# Patient Record
Sex: Female | Born: 1970 | Race: White | Hispanic: No | Smoking: Current every day smoker
Health system: Southern US, Community
[De-identification: ages and names within clinical notes are randomized; demographics above are authoritative.]

## PROBLEM LIST (undated history)

## (undated) DIAGNOSIS — E079 Disorder of thyroid, unspecified: Secondary | ICD-10-CM

## (undated) DIAGNOSIS — I10 Essential (primary) hypertension: Secondary | ICD-10-CM

## (undated) HISTORY — PX: LAPAROTOMY: SHX154

## (undated) HISTORY — PX: PANCREATIC PSEUDOCYST DRAINAGE: SHX2158

---

## 2020-01-26 ENCOUNTER — Other Ambulatory Visit: Payer: Self-pay

## 2020-01-26 ENCOUNTER — Emergency Department (HOSPITAL_COMMUNITY): Payer: Federal, State, Local not specified - PPO

## 2020-01-26 ENCOUNTER — Inpatient Hospital Stay (HOSPITAL_COMMUNITY): Payer: Federal, State, Local not specified - PPO

## 2020-01-26 ENCOUNTER — Encounter (HOSPITAL_COMMUNITY): Payer: Self-pay

## 2020-01-26 ENCOUNTER — Inpatient Hospital Stay (HOSPITAL_COMMUNITY)
Admission: EM | Admit: 2020-01-26 | Discharge: 2020-01-28 | DRG: 312 | Disposition: A | Discharge status: Rehabilitation Facility | Payer: Federal, State, Local not specified - PPO | Source: Other Acute Inpatient Hospital | Attending: Internal Medicine | Admitting: Internal Medicine

## 2020-01-26 DIAGNOSIS — D696 Thrombocytopenia, unspecified: Secondary | ICD-10-CM | POA: Diagnosis not present

## 2020-01-26 DIAGNOSIS — G9341 Metabolic encephalopathy: Secondary | ICD-10-CM | POA: Diagnosis present

## 2020-01-26 DIAGNOSIS — Z20822 Contact with and (suspected) exposure to covid-19: Secondary | ICD-10-CM | POA: Diagnosis present

## 2020-01-26 DIAGNOSIS — I10 Essential (primary) hypertension: Secondary | ICD-10-CM | POA: Diagnosis present

## 2020-01-26 DIAGNOSIS — R7989 Other specified abnormal findings of blood chemistry: Secondary | ICD-10-CM | POA: Diagnosis present

## 2020-01-26 DIAGNOSIS — F329 Major depressive disorder, single episode, unspecified: Secondary | ICD-10-CM | POA: Diagnosis present

## 2020-01-26 DIAGNOSIS — E039 Hypothyroidism, unspecified: Secondary | ICD-10-CM | POA: Diagnosis not present

## 2020-01-26 DIAGNOSIS — Y9289 Other specified places as the place of occurrence of the external cause: Secondary | ICD-10-CM

## 2020-01-26 DIAGNOSIS — E538 Deficiency of other specified B group vitamins: Secondary | ICD-10-CM | POA: Diagnosis present

## 2020-01-26 DIAGNOSIS — Z888 Allergy status to other drugs, medicaments and biological substances status: Secondary | ICD-10-CM

## 2020-01-26 DIAGNOSIS — Z79899 Other long term (current) drug therapy: Secondary | ICD-10-CM

## 2020-01-26 DIAGNOSIS — F1010 Alcohol abuse, uncomplicated: Secondary | ICD-10-CM | POA: Diagnosis not present

## 2020-01-26 DIAGNOSIS — I952 Hypotension due to drugs: Principal | ICD-10-CM | POA: Diagnosis present

## 2020-01-26 DIAGNOSIS — K760 Fatty (change of) liver, not elsewhere classified: Secondary | ICD-10-CM | POA: Diagnosis not present

## 2020-01-26 DIAGNOSIS — F17200 Nicotine dependence, unspecified, uncomplicated: Secondary | ICD-10-CM | POA: Diagnosis present

## 2020-01-26 DIAGNOSIS — Z885 Allergy status to narcotic agent status: Secondary | ICD-10-CM | POA: Diagnosis not present

## 2020-01-26 DIAGNOSIS — R188 Other ascites: Secondary | ICD-10-CM | POA: Diagnosis present

## 2020-01-26 DIAGNOSIS — K7682 Hepatic encephalopathy: Secondary | ICD-10-CM

## 2020-01-26 DIAGNOSIS — R160 Hepatomegaly, not elsewhere classified: Secondary | ICD-10-CM | POA: Diagnosis present

## 2020-01-26 DIAGNOSIS — G928 Other toxic encephalopathy: Secondary | ICD-10-CM | POA: Diagnosis present

## 2020-01-26 DIAGNOSIS — G92 Toxic encephalopathy: Secondary | ICD-10-CM | POA: Diagnosis not present

## 2020-01-26 DIAGNOSIS — T59891A Toxic effect of other specified gases, fumes and vapors, accidental (unintentional), initial encounter: Secondary | ICD-10-CM | POA: Diagnosis not present

## 2020-01-26 DIAGNOSIS — T465X5A Adverse effect of other antihypertensive drugs, initial encounter: Secondary | ICD-10-CM | POA: Diagnosis present

## 2020-01-26 DIAGNOSIS — K8020 Calculus of gallbladder without cholecystitis without obstruction: Secondary | ICD-10-CM | POA: Diagnosis present

## 2020-01-26 DIAGNOSIS — E639 Nutritional deficiency, unspecified: Secondary | ICD-10-CM | POA: Diagnosis present

## 2020-01-26 DIAGNOSIS — D539 Nutritional anemia, unspecified: Secondary | ICD-10-CM | POA: Diagnosis not present

## 2020-01-26 DIAGNOSIS — K7200 Acute and subacute hepatic failure without coma: Secondary | ICD-10-CM | POA: Diagnosis not present

## 2020-01-26 DIAGNOSIS — I959 Hypotension, unspecified: Secondary | ICD-10-CM | POA: Diagnosis not present

## 2020-01-26 HISTORY — DX: Essential (primary) hypertension: I10

## 2020-01-26 HISTORY — DX: Disorder of thyroid, unspecified: E07.9

## 2020-01-26 LAB — BASIC METABOLIC PANEL
Anion gap: 7 (ref 5–15)
BUN: 10 mg/dL (ref 6–20)
CO2: 24 mmol/L (ref 22–32)
Calcium: 7.5 mg/dL — ABNORMAL LOW (ref 8.9–10.3)
Chloride: 103 mmol/L (ref 98–111)
Creatinine, Ser: 0.57 mg/dL (ref 0.44–1.00)
GFR calc Af Amer: >60 mL/min (ref >60)
GFR calc non Af Amer: >60 mL/min (ref >60)
Glucose, Bld: 84 mg/dL (ref 70–99)
Potassium: 3.6 mmol/L (ref 3.5–5.1)
Sodium: 134 mmol/L — ABNORMAL LOW (ref 135–145)

## 2020-01-26 LAB — HEPATIC FUNCTION PANEL
ALT: 30 U/L (ref 0–44)
ALT: 28 U/L (ref 0–44)
AST: 77 U/L — ABNORMAL HIGH (ref 15–41)
AST: 53 U/L — ABNORMAL HIGH (ref 15–41)
Albumin: 2 g/dL — ABNORMAL LOW (ref 3.5–5.0)
Albumin: 1.9 g/dL — ABNORMAL LOW (ref 3.5–5.0)
Alkaline Phosphatase: 134 U/L — ABNORMAL HIGH (ref 38–126)
Alkaline Phosphatase: 118 U/L (ref 38–126)
Bilirubin, Direct: 0.6 mg/dL — ABNORMAL HIGH (ref 0.0–0.2)
Bilirubin, Direct: 0.1 mg/dL (ref 0.0–0.2)
Indirect Bilirubin: 0.5 mg/dL (ref 0.3–0.9)
Indirect Bilirubin: 0.2 mg/dL — ABNORMAL LOW (ref 0.3–0.9)
Total Bilirubin: 1.1 mg/dL (ref 0.3–1.2)
Total Bilirubin: 0.3 mg/dL (ref 0.3–1.2)
Total Protein: 4.5 g/dL — ABNORMAL LOW (ref 6.5–8.1)
Total Protein: 4.5 g/dL — ABNORMAL LOW (ref 6.5–8.1)

## 2020-01-26 LAB — VITAMIN B12: Vitamin B-12: 792 pg/mL (ref 180–914)

## 2020-01-26 LAB — RETICULOCYTES
Immature Retic Fract: 24.1 % — ABNORMAL HIGH (ref 2.3–15.9)
RBC.: 2.92 MIL/uL — ABNORMAL LOW (ref 3.87–5.11)
Retic Count, Absolute: 70.7 10*3/uL (ref 19.0–186.0)
Retic Ct Pct: 2.4 % (ref 0.4–3.1)

## 2020-01-26 LAB — CBC WITH DIFFERENTIAL/PLATELET
Abs Immature Granulocytes: 0.02 10*3/uL (ref 0.00–0.07)
Abs Immature Granulocytes: 0.02 10*3/uL (ref 0.00–0.07)
Basophils Absolute: 0 10*3/uL (ref 0.0–0.1)
Basophils Absolute: 0 10*3/uL (ref 0.0–0.1)
Basophils Relative: 0 %
Basophils Relative: 0 %
Eosinophils Absolute: 0.2 10*3/uL (ref 0.0–0.5)
Eosinophils Absolute: 0.1 10*3/uL (ref 0.0–0.5)
Eosinophils Relative: 3 %
Eosinophils Relative: 2 %
HCT: 28.1 % — ABNORMAL LOW (ref 36.0–46.0)
HCT: 30.9 % — ABNORMAL LOW (ref 36.0–46.0)
Hemoglobin: 9 g/dL — ABNORMAL LOW (ref 12.0–15.0)
Hemoglobin: 10 g/dL — ABNORMAL LOW (ref 12.0–15.0)
Immature Granulocytes: 0 %
Immature Granulocytes: 0 %
Lymphocytes Relative: 42 %
Lymphocytes Relative: 33 %
Lymphs Abs: 2.2 10*3/uL (ref 0.7–4.0)
Lymphs Abs: 1.7 10*3/uL (ref 0.7–4.0)
MCH: 33.2 pg (ref 26.0–34.0)
MCH: 33.9 pg (ref 26.0–34.0)
MCHC: 32 g/dL (ref 30.0–36.0)
MCHC: 32.4 g/dL (ref 30.0–36.0)
MCV: 103.7 fL — ABNORMAL HIGH (ref 80.0–100.0)
MCV: 104.7 fL — ABNORMAL HIGH (ref 80.0–100.0)
Monocytes Absolute: 0.7 10*3/uL (ref 0.1–1.0)
Monocytes Absolute: 0.6 10*3/uL (ref 0.1–1.0)
Monocytes Relative: 14 %
Monocytes Relative: 11 %
Neutro Abs: 2.2 10*3/uL (ref 1.7–7.7)
Neutro Abs: 2.7 10*3/uL (ref 1.7–7.7)
Neutrophils Relative %: 41 %
Neutrophils Relative %: 54 %
Platelets: 127 10*3/uL — ABNORMAL LOW (ref 150–400)
Platelets: 101 10*3/uL — ABNORMAL LOW (ref 150–400)
RBC: 2.71 MIL/uL — ABNORMAL LOW (ref 3.87–5.11)
RBC: 2.95 MIL/uL — ABNORMAL LOW (ref 3.87–5.11)
RDW: 17.7 % — ABNORMAL HIGH (ref 11.5–15.5)
RDW: 17.9 % — ABNORMAL HIGH (ref 11.5–15.5)
WBC: 5.4 10*3/uL (ref 4.0–10.5)
WBC: 5.1 10*3/uL (ref 4.0–10.5)
nRBC: 0 % (ref 0.0–0.2)
nRBC: 0 % (ref 0.0–0.2)

## 2020-01-26 LAB — LACTIC ACID, PLASMA
Lactic Acid, Venous: 1.3 mmol/L (ref 0.5–1.9)
Lactic Acid, Venous: 0.9 mmol/L (ref 0.5–1.9)

## 2020-01-26 LAB — I-STAT CHEM 8, ED
BUN: 11 mg/dL (ref 6–20)
Calcium, Ion: 1.12 mmol/L — ABNORMAL LOW (ref 1.15–1.40)
Chloride: 93 mmol/L — ABNORMAL LOW (ref 98–111)
Creatinine, Ser: 0.6 mg/dL (ref 0.44–1.00)
Glucose, Bld: 88 mg/dL (ref 70–99)
HCT: 32 % — ABNORMAL LOW (ref 36.0–46.0)
Hemoglobin: 10.9 g/dL — ABNORMAL LOW (ref 12.0–15.0)
Potassium: 3.9 mmol/L (ref 3.5–5.1)
Sodium: 131 mmol/L — ABNORMAL LOW (ref 135–145)
TCO2: 31 mmol/L (ref 22–32)

## 2020-01-26 LAB — URINALYSIS, ROUTINE W REFLEX MICROSCOPIC
Bilirubin Urine: NEGATIVE
Glucose, UA: NEGATIVE mg/dL
Hgb urine dipstick: NEGATIVE
Ketones, ur: NEGATIVE mg/dL
Leukocytes,Ua: NEGATIVE
Nitrite: NEGATIVE
Protein, ur: NEGATIVE mg/dL
Specific Gravity, Urine: 1.003 — ABNORMAL LOW (ref 1.005–1.030)
pH: 7 (ref 5.0–8.0)

## 2020-01-26 LAB — CBG MONITORING, ED
Glucose-Capillary: 88 mg/dL (ref 70–99)
Glucose-Capillary: 82 mg/dL (ref 70–99)
Glucose-Capillary: 94 mg/dL (ref 70–99)
Glucose-Capillary: 86 mg/dL (ref 70–99)
Glucose-Capillary: 79 mg/dL (ref 70–99)

## 2020-01-26 LAB — ABO/RH: ABO/RH(D): O POS

## 2020-01-26 LAB — FOLATE: Folate: 3.3 ng/mL — ABNORMAL LOW (ref >5.9)

## 2020-01-26 LAB — IRON AND TIBC
Iron: 39 ug/dL (ref 28–170)
Saturation Ratios: 24 % (ref 10.4–31.8)
TIBC: 159 ug/dL — ABNORMAL LOW (ref 250–450)
UIBC: 120 ug/dL

## 2020-01-26 LAB — HIV ANTIBODY (ROUTINE TESTING W REFLEX): HIV Screen 4th Generation wRfx: NONREACTIVE

## 2020-01-26 LAB — RAPID URINE DRUG SCREEN, HOSP PERFORMED
Amphetamines: NOT DETECTED
Barbiturates: NOT DETECTED
Benzodiazepines: POSITIVE — AB
Cocaine: NOT DETECTED
Opiates: NOT DETECTED
Tetrahydrocannabinol: NOT DETECTED

## 2020-01-26 LAB — TYPE AND SCREEN
ABO/RH(D): O POS
Antibody Screen: NEGATIVE

## 2020-01-26 LAB — FERRITIN: Ferritin: 23 ng/mL (ref 11–307)

## 2020-01-26 LAB — AMMONIA: Ammonia: 60 umol/L — ABNORMAL HIGH (ref 9–35)

## 2020-01-26 LAB — PROCALCITONIN: Procalcitonin: <0.1 ng/mL

## 2020-01-26 LAB — I-STAT BETA HCG BLOOD, ED (MC, WL, AP ONLY): I-stat hCG, quantitative: <5 m[IU]/mL (ref <5)

## 2020-01-26 LAB — SARS CORONAVIRUS 2 BY RT PCR (HOSPITAL ORDER, PERFORMED IN ~~LOC~~ HOSPITAL LAB): SARS Coronavirus 2: NEGATIVE

## 2020-01-26 LAB — TSH: TSH: 12.164 u[IU]/mL — ABNORMAL HIGH (ref 0.350–4.500)

## 2020-01-26 LAB — TROPONIN I (HIGH SENSITIVITY): Troponin I (High Sensitivity): 5 ng/L (ref <18)

## 2020-01-26 MED ORDER — LACTATED RINGERS IV BOLUS
1000.0000 mL | Freq: Once | INTRAVENOUS | Status: AC
  Administered 2020-01-26: 1000 mL via INTRAVENOUS

## 2020-01-26 MED ORDER — THIAMINE HCL 100 MG/ML IJ SOLN
100.0000 mg | Freq: Every day | INTRAMUSCULAR | Status: DC
  Administered 2020-01-26 – 2020-01-27 (×2): 100 mg via INTRAVENOUS
  Filled 2020-01-26 (×2): qty 2

## 2020-01-26 MED ORDER — ONDANSETRON HCL 4 MG PO TABS: 4.0000 mg | ORAL_TABLET | Freq: Four times a day (QID) | ORAL | Status: DC | PRN

## 2020-01-26 MED ORDER — LACTULOSE 10 GM/15ML PO SOLN
10.0000 g | Freq: Three times a day (TID) | ORAL | Status: DC
  Administered 2020-01-26 – 2020-01-27 (×5): 10 g via ORAL
  Filled 2020-01-26 (×4): qty 30

## 2020-01-26 MED ORDER — ONDANSETRON HCL 4 MG/2ML IJ SOLN: 4.0000 mg | Freq: Four times a day (QID) | INTRAMUSCULAR | Status: DC | PRN

## 2020-01-26 MED ORDER — ONDANSETRON HCL 4 MG/2ML IJ SOLN
4.0000 mg | Freq: Once | INTRAMUSCULAR | Status: AC
  Administered 2020-01-26: 4 mg via INTRAVENOUS
  Filled 2020-01-26: qty 2

## 2020-01-26 MED ORDER — THIAMINE HCL 100 MG/ML IJ SOLN: 100.0000 mg | Freq: Every day | INTRAMUSCULAR | Status: DC

## 2020-01-26 MED ORDER — GLUCAGON HCL RDNA (DIAGNOSTIC) 1 MG IJ SOLR
2.0000 mg | Freq: Once | INTRAMUSCULAR | Status: AC
  Administered 2020-01-26: 2 mg via INTRAVENOUS
  Filled 2020-01-26: qty 2

## 2020-01-26 MED ORDER — SODIUM CHLORIDE 0.9 % IV BOLUS
1000.0000 mL | Freq: Once | INTRAVENOUS | Status: AC
  Administered 2020-01-26: 1000 mL via INTRAVENOUS

## 2020-01-26 MED ORDER — SODIUM CHLORIDE 0.9 % IV SOLN: INTRAVENOUS | Status: DC

## 2020-01-26 MED ORDER — LEVOTHYROXINE SODIUM 50 MCG PO TABS
75.0000 ug | ORAL_TABLET | Freq: Every day | ORAL | Status: DC
  Administered 2020-01-27 – 2020-01-28 (×2): 75 ug via ORAL
  Filled 2020-01-26 (×2): qty 1

## 2020-01-26 MED ORDER — LEVOTHYROXINE SODIUM 100 MCG/5ML IV SOLN
37.5000 ug | Freq: Every day | INTRAVENOUS | Status: DC
  Administered 2020-01-26: 37.5 ug via INTRAVENOUS
  Filled 2020-01-26: qty 5

## 2020-01-26 MED ORDER — NALOXONE HCL 2 MG/2ML IJ SOSY
1.0000 mg | PREFILLED_SYRINGE | Freq: Once | INTRAMUSCULAR | Status: AC
  Administered 2020-01-26: 1 mg via INTRAVENOUS
  Filled 2020-01-26: qty 2

## 2020-01-26 NOTE — Care Plan (Addendum)
[name] is a 49 year old female with medical history significant for HTN, hypothyroidism, alcohol and benzodiazepine abuse who was undergoing detox at [facility] for the past 4 days and was found to have low normal blood pressure.  Patient was reportedly given her antihypertensives and became hypotensive with SBP in the 60s.  In the ED patient wass noted to have SBP in the 70s with map of 61 and no lactic acidosis and required 2 L of lactated ringer boluses with initiation of maintenance fluids.  UDS positive for benzodiazepines, TSH 12, ammonia 60, procalcitonin less than 0.10, hemoglobin 9.  CT head showed no acute findings but did show premature generalized atrophy.  Chest x-ray favored atelectasis.  For  TRH was called for admission with hypotension presumed secondary to antihypertensive regimen while on Librium protocol for alcohol withdrawal.  Hypotension, improving.  Likely related to antihypertensive regimen while on Librium protocol.  SBP in the 90s - low 100s while on IV fluids with MAP in 80s currently .  Doubt infection given unremarkable procalcitonin, no localizing symptoms, no acute findings on imaging. -Continue IV fluids, decrease rate to 100 cc given concern for cirrhosis to prevent hypovolemia -Hold off on blood pressure regimen.  (Home lisinopril, amlodipine)  Acute metabolic encephalopathy, improved.  Somnolent and lethargic earlier this morning,  now alert oriented x4 still has some delayed speech and slowed thinking.  CT head nonacute.  Likely  related to sedative effect of Librium?.  Ammonia in the 60s  with high concern for cirrhosis -Trial lactulose -Delirium precautions -Avoid sedatives, holding home Valium -Continue IV thiamine, check thiamine levels  Hypothyroidism.  Suspect poor adherence given TSH elevated at greater than 12.  Do not suspect hypotension related to hypothyroidism as blood pressure is improved with IV fluids, has no bradycardia -Tolerating  p.o. we will transition from IV to oral Synthroid.  Thrombocytopenia, elevated ammonia, alcohol abuse, high concern for cirrhosis -Right upper quadrant ultrasound while in hospital  History of alcohol abuse.  Was undergoing detox prior to admission.  No current signs of withdrawal  Remainder per H&P from earlier today.  Physical exam Alert, slow speech but oriented to person, place, contacts Regular rate and rhythm, no edema Abdomen soft, nondistended, nontender, diminished bowel sounds No appreciable focal deficits

## 2020-01-26 NOTE — ED Notes (Signed)
[name], sister-in-law, would like to be contacted at [phone].

## 2020-01-26 NOTE — ED Notes (Signed)
Patient placed in trendelenburg per Nicanor Alcon, MD.

## 2020-01-26 NOTE — ED Notes (Signed)
Nolon Stalls [phone] Called from [facility] to check on pt

## 2020-01-26 NOTE — ED Notes (Signed)
ED TO INPATIENT HANDOFF REPORT  ED Nurse Name and Phone #: Sharene Skeans [phone]  S Name/Age/Gender Krystal Marquez 49 y.o. female Room/Bed: WA17/WA17  Code Status   Code Status: Full Code  Home/SNF/Other Home Patient oriented to: self, place, time and situation Is this baseline? 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 Codeine Nausea Only    "I don't want any that are in this class of drugs"  . Hydrocodone Nausea Only    "I don't want any that are in this class of drugs"  . Oxycodone Nausea Only    "I don't want any that are in this class of drugs"  . Prochlorperazine Edisylate     "lock jaw"    Level of Care/Admitting Diagnosis ED Disposition    ED Disposition Condition Comment   Admit  Hospital Area[facility] [[id number]]  Level of Care: Stepdown [14]  Admit to SDU based on following criteria: Severe physiological/psychological symptoms:  Any diagnosis requiring assessment & intervention at least every 4 hours on an ongoing basis to obtain desired patient outcomes including stability and rehabilitation  Covid Evaluation: Asymptomatic Screening Protocol (No Symptoms)  Diagnosis: Hypotension [656812]  Admitting Physician: Eduard Clos [id number]  Attending Physician: Eduard Clos [phone]  Estimated length of stay: past midnight tomorrow  Certification:: I certify this patient will need inpatient services for at least 2 midnights       B Medical/Surgery History Past Medical History:  Diagnosis Date  . Hypertension   . Thyroid disease    Past Surgical History:  Procedure Laterality Date  . LAPAROTOMY    . PANCREATIC PSEUDOCYST DRAINAGE       A IV Location/Drains/Wounds Patient  Lines/Drains/Airways Status   Active Line/Drains/Airways    Name:   Placement date:   Placement time:   Site:   Days:   Peripheral IV 01/26/20 Hand   01/26/20    0056    Hand   less than 1   Peripheral IV 01/26/20 Left;Posterior Forearm   01/26/20    0205    Forearm   less than 1          Intake/Output Last 24 hours  Intake/Output Summary (Last 24 hours) at 01/26/2020 2249 Last data filed at 01/26/2020 2157 Gross per 24 hour  Intake 2000 ml  Output --  Net 2000 ml    Labs/Imaging Results for orders placed or performed during the hospital encounter of 01/26/20 (from the past 48 hour(s))  CBC with Differential/Platelet     Status: Abnormal   Collection Time: 01/26/20  1:54 AM  Result Value Ref Range   WBC 5.4 4.0 - 10.5 K/uL   RBC 2.71 (L) 3.87 - 5.11 MIL/uL   Hemoglobin 9.0 (L) 12.0 - 15.0 g/dL   HCT 49.4 (L) 49.6 - 75.9 %   MCV 103.7 (H) 80.0 - 100.0 fL   MCH 33.2 26.0 - 34.0 pg   MCHC 32.0 30.0 - 36.0 g/dL   RDW 16.3 (H) 84.6 - 65.9 %   Platelets 127 (L) 150 - 400 K/uL    Comment: Immature Platelet Fraction may be clinically indicated, consider ordering this additional test [id number] REPEATED TO VERIFY    nRBC 0.0 0.0 - 0.2 %  Neutrophils Relative % 41 %   Neutro Abs 2.2 1.7 - 7.7 K/uL   Lymphocytes Relative 42 %   Lymphs Abs 2.2 0.7 - 4.0 K/uL   Monocytes Relative 14 %   Monocytes Absolute 0.7 0.1 - 1.0 K/uL   Eosinophils Relative 3 %   Eosinophils Absolute 0.2 0.0 - 0.5 K/uL   Basophils Relative 0 %   Basophils Absolute 0.0 0.0 - 0.1 K/uL   Immature Granulocytes 0 %   Abs Immature Granulocytes 0.02 0.00 - 0.07 K/uL    Comment: Performed at [facility], [place] [address], [place], [place] [address]  Hepatic function panel     Status: Abnormal   Collection Time: 01/26/20  1:54 AM  Result Value Ref Range   Total Protein 4.5 (L) 6.5 - 8.1 g/dL   Albumin 2.0 (L) 3.5 - 5.0 g/dL   AST 77 (H) 15 - 41 U/L   ALT 30 0 - 44 U/L   Alkaline Phosphatase  134 (H) 38 - 126 U/L   Total Bilirubin 1.1 0.3 - 1.2 mg/dL   Bilirubin, Direct 0.6 (H) 0.0 - 0.2 mg/dL   Indirect Bilirubin 0.5 0.3 - 0.9 mg/dL    Comment: Performed at [facility], [place] [address], [place], [place] [address]  I-Stat Beta hCG blood, ED ([facility], [facility], [facility] only)     Status: None   Collection Time: 01/26/20  2:00 AM  Result Value Ref Range   I-stat hCG, quantitative <5.0 <5 mIU/mL   Comment 3            Comment:   GEST. AGE      CONC.  (mIU/mL)   <=1 WEEK        5 - 50     2 WEEKS       50 - 500     3 WEEKS       100 - 10,000     4 WEEKS     1,000 - 30,000        FEMALE AND NON-PREGNANT FEMALE:     LESS THAN 5 mIU/mL   I-stat chem 8, ED (not at [facility] or [facility])     Status: Abnormal   Collection Time: 01/26/20  2:10 AM  Result Value Ref Range   Sodium 131 (L) 135 - 145 mmol/L   Potassium 3.9 3.5 - 5.1 mmol/L   Chloride 93 (L) 98 - 111 mmol/L   BUN 11 6 - 20 mg/dL   Creatinine, Ser 0.60 0.44 - 1.00 mg/dL   Glucose, Bld 88 70 - 99 mg/dL    Comment: Glucose reference range applies only to samples taken after fasting for at least 8 hours.   Calcium, Ion 1.12 (L) 1.15 - 1.40 mmol/L   TCO2 31 22 - 32 mmol/L   Hemoglobin 10.9 (L) 12.0 - 15.0 g/dL   HCT 32.0 (L) 36.0 - 46.0 %  Urinalysis, Routine w reflex microscopic     Status: Abnormal   Collection Time: 01/26/20  2:20 AM  Result Value Ref Range   Color, Urine STRAW (A) YELLOW   APPearance CLEAR CLEAR   Specific Gravity, Urine 1.003 (L) 1.005 - 1.030   pH 7.0 5.0 - 8.0   Glucose, UA NEGATIVE NEGATIVE mg/dL   Hgb urine dipstick NEGATIVE NEGATIVE   Bilirubin Urine NEGATIVE NEGATIVE   Ketones, ur NEGATIVE NEGATIVE mg/dL   Protein, ur NEGATIVE NEGATIVE mg/dL   Nitrite NEGATIVE NEGATIVE   Leukocytes,Ua NEGATIVE NEGATIVE  Comment: Performed at [facility], [address], [place], [place] [address]  SARS Coronavirus 2 by RT PCR (hospital order, performed in [facility] hospital lab)  Nasopharyngeal Nasopharyngeal Swab     Status: None   Collection Time: 01/26/20  4:20 AM   Specimen: Nasopharyngeal Swab  Result Value Ref Range   SARS Coronavirus 2 NEGATIVE NEGATIVE    Comment: (NOTE) SARS-CoV-2 target nucleic acids are NOT DETECTED. The SARS-CoV-2 RNA is generally detectable in upper and lower respiratory specimens during the acute phase of infection. The lowest concentration of SARS-CoV-2 viral copies this assay can detect is 250 copies / mL. A negative result does not preclude SARS-CoV-2 infection and should not be used as the sole basis for treatment or other patient management decisions.  A negative result may occur with improper specimen collection / handling, submission of specimen other than nasopharyngeal swab, presence of viral mutation(s) within the areas targeted by this assay, and inadequate number of viral copies (<250 copies / mL). A negative result must be combined with clinical observations, patient history, and epidemiological information. Fact Sheet for Patients:   BoilerBrush.com.cy Fact Sheet for Healthcare Providers: https://pope.com/ This test is not yet approved or cleared  by the [place] FDA and has been authorized for detection and/or diagnosis of SARS-CoV-2 by FDA under an Emergency Use Authorization (EUA).  This EUA will remain in effect (meaning this test can be used) for the duration of the COVID-19 declaration under Section 564(b)(1) of the Act, 21 U.S.C. section 360bbb-3(b)(1), unless the authorization is terminated or revoked sooner. Performed at [facility], [address] [place], [place] [address]   Urine rapid drug screen (hosp performed)     Status: Abnormal   Collection Time: 01/26/20  5:51 AM  Result Value Ref Range   Opiates NONE DETECTED NONE DETECTED   Cocaine NONE DETECTED NONE DETECTED   Benzodiazepines POSITIVE (A) NONE DETECTED    Amphetamines NONE DETECTED NONE DETECTED   Tetrahydrocannabinol NONE DETECTED NONE DETECTED   Barbiturates NONE DETECTED NONE DETECTED    Comment: (NOTE) DRUG SCREEN FOR MEDICAL PURPOSES ONLY.  IF CONFIRMATION IS NEEDED FOR ANY PURPOSE, NOTIFY LAB WITHIN 5 DAYS. LOWEST DETECTABLE LIMITS FOR URINE DRUG SCREEN Drug Class                     Cutoff (ng/mL) Amphetamine and metabolites    1000 Barbiturate and metabolites    200 Benzodiazepine                 200 Tricyclics and metabolites     300 Opiates and metabolites        300 Cocaine and metabolites        300 THC                            50 Performed at [facility], [address], [place], [place] [address]   CBG monitoring, ED     Status: None   Collection Time: 01/26/20  6:04 AM  Result Value Ref Range   Glucose-Capillary 88 70 - 99 mg/dL    Comment: Glucose reference range applies only to samples taken after fasting for at least 8 hours.  ABO/Rh     Status: None   Collection Time: 01/26/20  7:00 AM  Result Value Ref Range   ABO/RH(D)      O POS Performed at [facility], [address], [place], [place] [address]   HIV Antibody (routine testing w rflx)     Status: None   Collection Time: 01/26/20  7:01 AM  Result Value Ref Range   HIV Screen 4th Generation wRfx Non Reactive Non Reactive    Comment: Performed at [facility], [address], [place], [place] [address]  Basic metabolic panel     Status: Abnormal   Collection Time: 01/26/20  7:01 AM  Result Value Ref Range   Sodium 134 (L) 135 - 145 mmol/L   Potassium 3.6 3.5 - 5.1 mmol/L   Chloride 103 98 - 111 mmol/L   CO2 24 22 - 32 mmol/L   Glucose, Bld 84 70 - 99 mg/dL    Comment: Glucose reference range applies only to samples taken after fasting for at least 8 hours.   BUN 10 6 - 20 mg/dL   Creatinine, Ser 0.98 0.44 - 1.00 mg/dL   Calcium 7.5 (L) 8.9 - 10.3 mg/dL   GFR calc non Af Amer >60 >60 mL/min   GFR calc  Af Amer >60 >60 mL/min   Anion gap 7 5 - 15    Comment: Performed at [facility], [address] [place], [place] [address]  Hepatic function panel     Status: Abnormal   Collection Time: 01/26/20  7:01 AM  Result Value Ref Range   Total Protein 4.5 (L) 6.5 - 8.1 g/dL   Albumin 1.9 (L) 3.5 - 5.0 g/dL   AST 53 (H) 15 - 41 U/L   ALT 28 0 - 44 U/L   Alkaline Phosphatase 118 38 - 126 U/L   Total Bilirubin 0.3 0.3 - 1.2 mg/dL   Bilirubin, Direct 0.1 0.0 - 0.2 mg/dL   Indirect Bilirubin 0.2 (L) 0.3 - 0.9 mg/dL    Comment: Performed at [facility], [address], [place], [place] [address]  CBC WITH DIFFERENTIAL     Status: Abnormal   Collection Time: 01/26/20  7:01 AM  Result Value Ref Range   WBC 5.1 4.0 - 10.5 K/uL   RBC 2.95 (L) 3.87 - 5.11 MIL/uL   Hemoglobin 10.0 (L) 12.0 - 15.0 g/dL   HCT 62.1 (L) 30.8 - 65.7 %   MCV 104.7 (H) 80.0 - 100.0 fL   MCH 33.9 26.0 - 34.0 pg   MCHC 32.4 30.0 - 36.0 g/dL   RDW 84.6 (H) 96.2 - 95.2 %   Platelets 101 (L) 150 - 400 K/uL    Comment: SPECIMEN CHECKED FOR CLOTS Immature Platelet Fraction may be clinically indicated, consider ordering this additional test [id number] PLATELET COUNT CONFIRMED BY SMEAR REPEATED TO VERIFY    nRBC 0.0 0.0 - 0.2 %   Neutrophils Relative % 54 %   Neutro Abs 2.7 1.7 - 7.7 K/uL   Lymphocytes Relative 33 %   Lymphs Abs 1.7 0.7 - 4.0 K/uL   Monocytes Relative 11 %   Monocytes Absolute 0.6 0.1 - 1.0 K/uL   Eosinophils Relative 2 %   Eosinophils Absolute 0.1 0.0 - 0.5 K/uL   Basophils Relative 0 %   Basophils Absolute 0.0 0.0 - 0.1 K/uL   Immature Granulocytes 0 %   Abs Immature Granulocytes 0.02 0.00 - 0.07 K/uL    Comment: Performed at [facility], [address] [place], [place] [address]  TSH     Status: Abnormal   Collection Time: 01/26/20  7:01 AM  Result Value Ref Range  TSH 12.164 (H) 0.350 - 4.500 uIU/mL    Comment: Performed by a 3rd  Generation assay with a functional sensitivity of <=0.01 uIU/mL. Performed at [facility], [address] [place], [place] [address]   Lactic acid, plasma     Status: None   Collection Time: 01/26/20  7:01 AM  Result Value Ref Range   Lactic Acid, Venous 1.3 0.5 - 1.9 mmol/L    Comment: Performed at [facility], [address] [place], [place] [address]  Troponin I (High Sensitivity)     Status: None   Collection Time: 01/26/20  7:01 AM  Result Value Ref Range   Troponin I (High Sensitivity) 5 <18 ng/L    Comment: (NOTE) Elevated high sensitivity troponin I (hsTnI) values and significant  changes across serial measurements may suggest ACS but many other  chronic and acute conditions are known to elevate hsTnI results.  Refer to the "Links" section for chest pain algorithms and additional  guidance. Performed at [facility], [address] [place], [place] [address]   Ammonia     Status: Abnormal   Collection Time: 01/26/20  7:01 AM  Result Value Ref Range   Ammonia 60 (H) 9 - 35 umol/L    Comment: Performed at [facility], [address] [place], [place] [address]  Culture, blood (routine x 2)     Status: None (Preliminary result)   Collection Time: 01/26/20  7:01 AM   Specimen: BLOOD  Result Value Ref Range   Specimen Description      BLOOD BLOOD LEFT FOREARM Performed at [facility], [address] [place], [place] [address]    Special Requests      BOTTLES DRAWN AEROBIC AND ANAEROBIC Blood Culture results may not be optimal due to an inadequate volume of blood received in culture bottles Performed at [facility], [address] [place], [place] [address]    Culture      NO GROWTH < 12 HOURS Performed at [facility], [address] [place], [place] [address]    Report Status PENDING   Type and screen [facility]      Status: None   Collection Time: 01/26/20  7:01 AM  Result Value Ref Range   ABO/RH(D) O POS    Antibody Screen NEG    Sample Expiration      01/29/2020,2359 Performed at [facility], [address] [place], [place] [address]   Vitamin B12     Status: None   Collection Time: 01/26/20  7:01 AM  Result Value Ref Range   Vitamin B-12 792 180 - 914 pg/mL    Comment: (NOTE) This assay is not validated for testing neonatal or myeloproliferative syndrome specimens for Vitamin B12 levels. Performed at [facility], [address] [place], [place] [address]   Folate     Status: Abnormal   Collection Time: 01/26/20  7:01 AM  Result Value Ref Range   Folate 3.3 (L) >5.9 ng/mL    Comment: Performed at [facility], [address] [place], [place] [address]  Iron and TIBC     Status: Abnormal   Collection Time: 01/26/20  7:01 AM  Result Value Ref Range   Iron 39 28 - 170 ug/dL   TIBC 027159 (L) 253250 - 664450 ug/dL   Saturation Ratios 24 10.4 - 31.8 %   UIBC 120 ug/dL  Comment: Performed at [facility], [address] [place], [place] [address]  Ferritin     Status: None   Collection Time: 01/26/20  7:01 AM  Result Value Ref Range   Ferritin 23 11 - 307 ng/mL    Comment: Performed at [facility], [address] [place], [place] [address]  Reticulocytes     Status: Abnormal   Collection Time: 01/26/20  7:01 AM  Result Value Ref Range   Retic Ct Pct 2.4 0.4 - 3.1 %   RBC. 2.92 (L) 3.87 - 5.11 MIL/uL   Retic Count, Absolute 70.7 19.0 - 186.0 K/uL   Immature Retic Fract 24.1 (H) 2.3 - 15.9 %    Comment: Performed at [facility], [address] [place], [place] [address]  Procalcitonin - Baseline     Status: None   Collection Time: 01/26/20  7:01 AM  Result Value Ref Range   Procalcitonin <0.10 ng/mL    Comment:        Interpretation: PCT (Procalcitonin) <= 0.5  ng/mL: Systemic infection (sepsis) is not likely. Local bacterial infection is possible. (NOTE)       Sepsis PCT Algorithm           Lower Respiratory Tract                                      Infection PCT Algorithm    ----------------------------     ----------------------------         PCT < 0.25 ng/mL                PCT < 0.10 ng/mL         Strongly encourage             Strongly discourage   discontinuation of antibiotics    initiation of antibiotics    ----------------------------     -----------------------------       PCT 0.25 - 0.50 ng/mL            PCT 0.10 - 0.25 ng/mL               OR       >80% decrease in PCT            Discourage initiation of                                            antibiotics      Encourage discontinuation           of antibiotics    ----------------------------     -----------------------------         PCT >= 0.50 ng/mL              PCT 0.26 - 0.50 ng/mL               AND        <80% decrease in PCT             Encourage initiation of                                             antibiotics  Encourage continuation           of antibiotics    ----------------------------     -----------------------------        PCT >= 0.50 ng/mL                  PCT > 0.50 ng/mL               AND         increase in PCT                  Strongly encourage                                      initiation of antibiotics    Strongly encourage escalation           of antibiotics                                     -----------------------------                                           PCT <= 0.25 ng/mL                                                 OR                                        > 80% decrease in PCT                                     Discontinue / Do not initiate                                             antibiotics Performed at [facility], [address] [place], [place] [address]   CBG monitoring, ED     Status: None    Collection Time: 01/26/20  7:54 AM  Result Value Ref Range   Glucose-Capillary 82 70 - 99 mg/dL    Comment: Glucose reference range applies only to samples taken after fasting for at least 8 hours.  Lactic acid, plasma     Status: None   Collection Time: 01/26/20 10:33 AM  Result Value Ref Range   Lactic Acid, Venous 0.9 0.5 - 1.9 mmol/L    Comment: Performed at [facility], [address], [place], [place] [address]  Culture, blood (routine x 2)     Status: None (Preliminary result)   Collection Time: 01/26/20 10:33 AM   Specimen: BLOOD  Result Value Ref Range   Specimen Description      BLOOD RIGHT ANTECUBITAL Performed at [facility], [address] [place], [place] [address]    Special Requests      BOTTLES  DRAWN AEROBIC AND ANAEROBIC Blood Culture adequate volume Performed at [facility], [address] [place], [place] [address]    Culture      NO GROWTH < 12 HOURS Performed at [facility], [address], [place], [place] [address]    Report Status PENDING   CBG monitoring, ED     Status: None   Collection Time: 01/26/20 12:01 PM  Result Value Ref Range   Glucose-Capillary 94 70 - 99 mg/dL    Comment: Glucose reference range applies only to samples taken after fasting for at least 8 hours.  CBG monitoring, ED     Status: None   Collection Time: 01/26/20  4:07 PM  Result Value Ref Range   Glucose-Capillary 86 70 - 99 mg/dL    Comment: Glucose reference range applies only to samples taken after fasting for at least 8 hours.  CBG monitoring, ED     Status: None   Collection Time: 01/26/20  8:08 PM  Result Value Ref Range   Glucose-Capillary 79 70 - 99 mg/dL    Comment: Glucose reference range applies only to samples taken after fasting for at least 8 hours.   CT HEAD WO CONTRAST  Result Date: 01/26/2020 CLINICAL DATA:  Encephalopathy.  Undergoing detox. EXAM: CT HEAD WITHOUT CONTRAST TECHNIQUE:  Contiguous axial images were obtained from the base of the skull through the vertex without intravenous contrast. COMPARISON:  None. FINDINGS: Brain: No evidence of acute infarction, hemorrhage, hydrocephalus, extra-axial collection or mass lesion/mass effect. Premature cortical atrophy Vascular: No hyperdense vessel or unexpected calcification. Skull: Normal. Negative for fracture or focal lesion. Sinuses/Orbits: No acute finding. IMPRESSION: 1. No acute finding. 2. Premature generalized atrophy. Electronically Signed   By: Marnee Spring M.D.   On: 01/26/2020 07:34   DG Chest Portable 1 View  Result Date: 01/26/2020 CLINICAL DATA:  Hypotension EXAM: PORTABLE CHEST 1 VIEW COMPARISON:  None FINDINGS: Low lung volumes with streaky basilar opacities favoring atelectasis. No consolidation, features of edema, pneumothorax, or effusion. The cardiomediastinal contours are unremarkable. No acute osseous or soft tissue abnormality. Telemetry leads overlie the chest. IMPRESSION: Low lung volumes with streaky basilar opacities favoring atelectasis. Electronically Signed   By: Kreg Shropshire M.D.   On: 01/26/2020 01:31   US Abdomen Limited RUQ  Result Date: 01/26/2020 CLINICAL DATA:  Abnormal LFTs. EXAM: ULTRASOUND ABDOMEN LIMITED RIGHT UPPER QUADRANT COMPARISON:  None. FINDINGS: Gallbladder: Multiple gallstones are noted. There is mild gallbladder wall thickening with the gallbladder wall measuring up to approximately 3 mm. There is some ascites versus pericholecystic free fluid. The sonographic Eulah Pont sign is reported as negative. Common bile duct: Diameter: 7 mm Liver: Liver is enlarged and echogenic. Portal vein is patent on color Doppler imaging with normal direction of blood flow towards the liver. Other: There is a small volume of ascites in the upper abdomen. IMPRESSION: 1. There is cholelithiasis without secondary signs of acute cholecystitis. 2. Borderline dilated common bile duct measuring up to approximately  7 mm. Correlation with laboratory studies is recommended. 3. Hepatomegaly with likely underlying hepatic steatosis. 4. Small volume abdominal ascites. Electronically Signed   By: Katherine Mantle M.D.   On: 01/26/2020 17:52    Pending Labs Unresulted Labs (From admission, onward)    Start     Ordered   01/27/20 0500  CBC  Tomorrow morning,   R     01/26/20 1706   01/27/20 0500  Comprehensive metabolic panel  Daily,   R  01/26/20 1706   01/26/20 0549  Vitamin B1  ONCE - STAT,   STAT     01/26/20 0549          Vitals/Pain Today's Vitals   01/26/20 2030 01/26/20 2100 01/26/20 2130 01/26/20 2203  BP: 102/76 102/72 98/72 (!) 129/96  Pulse:    78  Resp: (!) 21 19 (!) 23 (!) 22  Temp:      TempSrc:      SpO2:    95%  Weight:      Height:      PainSc:    0-No pain    Isolation Precautions No active isolations  Medications Medications  ondansetron (ZOFRAN) tablet 4 mg (has no administration in time range)    Or  ondansetron (ZOFRAN) injection 4 mg (has no administration in time range)  0.9 %  sodium chloride infusion ( Intravenous Stopped 01/26/20 2157)  thiamine (B-1) injection 100 mg (100 mg Intravenous Given 01/26/20 1213)  lactulose (CHRONULAC) 10 GM/15ML solution 10 g (10 g Oral Given 01/26/20 1852)  levothyroxine (SYNTHROID) tablet 75 mcg (has no administration in time range)  glucagon (human recombinant) (GLUCAGEN) injection 2 mg (2 mg Intravenous Given 01/26/20 0217)  ondansetron (ZOFRAN) injection 4 mg (4 mg Intravenous Given 01/26/20 0230)  sodium chloride 0.9 % bolus 1,000 mL (0 mLs Intravenous Stopped 01/26/20 0303)  naloxone (NARCAN) injection 1 mg (1 mg Intravenous Given 01/26/20 0303)  lactated ringers bolus 1,000 mL (0 mLs Intravenous Stopped 01/26/20 0659)    Mobility walks High fall risk   Focused Assessments GEN MED   R Recommendations: See Admitting Provider Note  Report given to:   Additional Notes: Called to give report and nurse will call  back

## 2020-01-26 NOTE — ED Provider Notes (Signed)
[facility] DEPT Provider Note   CSN: [id number] Arrival date & time: 01/26/20  0040     History No chief complaint on file.   Krystal Marquez is a 49 y.o. female.  The history is provided by the patient.  [facility]ness [facility] Quality:  Hypotensive but they have not taken her off her BP meds but have added high dose librium  Severity:  Moderate Onset quality:  Gradual Duration:  4 days Timing:  Constant Progression:  Unchanged Chronicity:  New Context:  Drug and alcohol rehab Relieved by:  Nothing Worsened by:  Giving BP meds when the BP was low Ineffective treatments:  None Associated symptoms: no abdominal pain, no chest pain, no congestion, no cough, no diarrhea, no ear pain, no fatigue, no fever, no headaches, no loss of consciousness, no myalgias, no nausea, no rash, no rhinorrhea, no shortness of breath, no sore throat, no vomiting and no wheezing        Past Medical History:  Diagnosis Date  . Hypertension   . Thyroid disease     There are no problems to display for this patient.   History reviewed. No pertinent surgical history.   OB History   No obstetric history on file.     History reviewed. No pertinent family history.  Social History   Tobacco Use  . Smoking status: Current Every Day Smoker  . Smokeless tobacco: Current User  Substance Use Topics  . Alcohol use: Yes  . Drug use: Yes    Home Medications Prior to Admission medications   Medication Sig Start Date End Date Taking? Authorizing Provider  acetaminophen (TYLENOL) 500 MG tablet Take 1,000 mg by mouth every 6 (six) hours as needed for mild pain.   Yes [provider]  alum & mag hydroxide-simeth (MAALOX/MYLANTA) 200-200-20 MG/5ML suspension Take 10-20 mLs by mouth every 6 (six) hours as needed for indigestion or heartburn.   Yes [provider]  amLODipine (NORVASC) 5 MG tablet Take 5 mg by mouth daily.   Yes [provider]  anti-nausea (EMETROL) solution Take 30 mLs by mouth every 6 (six) hours as needed for nausea or vomiting.   Yes [provider]  bismuth subsalicylate (PEPTO BISMOL) 262 MG/15ML suspension Take 30 mLs by mouth every 6 (six) hours as needed (upset stomach).   Yes [provider]  cetirizine (ZYRTEC) 10 MG tablet Take 10 mg by mouth daily as needed for allergies.   Yes [provider]  chlordiazePOXIDE (LIBRIUM) 5 MG capsule Take 5 mg by mouth 4 (four) times daily. x4 doses then stop   Yes [provider]  diazepam (VALIUM) 5 MG/ML injection Inject 10 mg into the vein See admin instructions. STAT for seizures, may repeat x1 in 30 seconds if seizure persists   Yes [provider]  fluticasone (FLONASE) 50 MCG/ACT nasal spray Place 2 sprays into both nostrils daily.   Yes [provider]  ibuprofen (ADVIL) 200 MG tablet Take 600 mg by mouth every 6 (six) hours as needed for fever or moderate pain.   Yes [provider]  levothyroxine (SYNTHROID) 75 MCG tablet Take 75 mcg by mouth daily before breakfast.   Yes [provider]  lisinopril (ZESTRIL) 10 MG tablet Take 10 mg by mouth daily.   Yes [provider]  loperamide (IMODIUM A-D) 2 MG tablet Take 4 mg by mouth 4 (four) times daily as needed for diarrhea or loose stools.  Yes [provider]  montelukast (SINGULAIR) 10 MG tablet Take 10 mg by mouth at bedtime.   Yes [provider]  Multiple Vitamins-Minerals (ADULT GUMMY) CHEW Chew 1 tablet by mouth daily.   Yes [provider]  ondansetron (ZOFRAN) 8 MG tablet Take 8 mg by mouth 2 (two) times daily as needed for nausea or vomiting.   Yes [provider]  PARoxetine (PAXIL) 20 MG tablet Take 20 mg by mouth daily.   Yes [provider]  thiamine 100 MG tablet Take 100 mg by mouth daily.   Yes [provider]  traZODone (DESYREL) 50 MG tablet Take 50 mg  by mouth at bedtime as needed for sleep.   Yes [provider]  vortioxetine HBr (TRINTELLIX) 5 MG TABS tablet Take 5 mg by mouth daily.   Yes [provider]    Allergies    Codeine, Hydrocodone, Oxycodone, and Prochlorperazine edisylate  Review of Systems   Review of Systems  Constitutional: Negative for fatigue and fever.  HENT: Negative for congestion, ear pain, rhinorrhea and sore throat.   Eyes: Negative for visual disturbance.  Respiratory: Negative for cough, shortness of breath and wheezing.   Cardiovascular: Negative for chest pain, palpitations and leg swelling.  Gastrointestinal: Negative for abdominal pain, diarrhea, nausea and vomiting.  Genitourinary: Negative for dyspareunia and dysuria.  Musculoskeletal: Negative for myalgias.  Skin: Negative for rash.  Neurological: Negative for loss of consciousness, weakness, numbness and headaches.  Psychiatric/Behavioral: Negative for agitation.  All other systems reviewed and are negative.   Physical Exam Updated Vital Signs BP (!) 84/59   Pulse 70   Temp 98 F (36.7 C) (Oral)   Resp (!) 21   Ht 5\' 2"  (1.575 m)   Wt 52.2 kg   LMP 07/29/2019   SpO2 97%   BMI 21.03 kg/m   Physical Exam Vitals and nursing note reviewed.  Constitutional:      General: She is not in acute distress.    Appearance: Normal appearance.  HENT:     Head: Normocephalic and atraumatic.     Nose: Nose normal.  Eyes:     Extraocular Movements: Extraocular movements intact.     Pupils: Pupils are equal, round, and reactive to light.  Cardiovascular:     Rate and Rhythm: Normal rate and regular rhythm.     Pulses: Normal pulses.     Heart sounds: Normal heart sounds.  Pulmonary:     Effort: Pulmonary effort is normal.     Breath sounds: Normal breath sounds.  Abdominal:     General: Abdomen is flat. Bowel sounds are normal.     Tenderness: There is no abdominal tenderness. There is no guarding.  Musculoskeletal:         General: Normal range of motion.     Cervical back: Normal range of motion and neck supple.  Skin:    General: Skin is warm and dry.     Capillary Refill: Capillary refill takes less than 2 seconds.  Neurological:     General: No focal deficit present.     Mental Status: She is alert and oriented to person, place, and time.     Deep Tendon Reflexes: Reflexes normal.  Psychiatric:        Mood and Affect: Mood normal.        Behavior: Behavior normal.     ED Results / Procedures / Treatments   Labs (all labs ordered are listed, but only abnormal results  are displayed) Results for orders placed or performed during the hospital encounter of 01/26/20  CBC with Differential/Platelet  Result Value Ref Range   WBC 5.4 4.0 - 10.5 K/uL   RBC 2.71 (L) 3.87 - 5.11 MIL/uL   Hemoglobin 9.0 (L) 12.0 - 15.0 g/dL   HCT 85.4 (L) 62.7 - 03.5 %   MCV 103.7 (H) 80.0 - 100.0 fL   MCH 33.2 26.0 - 34.0 pg   MCHC 32.0 30.0 - 36.0 g/dL   RDW 00.9 (H) 38.1 - 82.9 %   Platelets 127 (L) 150 - 400 K/uL   nRBC 0.0 0.0 - 0.2 %   Neutrophils Relative % 41 %   Neutro Abs 2.2 1.7 - 7.7 K/uL   Lymphocytes Relative 42 %   Lymphs Abs 2.2 0.7 - 4.0 K/uL   Monocytes Relative 14 %   Monocytes Absolute 0.7 0.1 - 1.0 K/uL   Eosinophils Relative 3 %   Eosinophils Absolute 0.2 0.0 - 0.5 K/uL   Basophils Relative 0 %   Basophils Absolute 0.0 0.0 - 0.1 K/uL   Immature Granulocytes 0 %   Abs Immature Granulocytes 0.02 0.00 - 0.07 K/uL  Urinalysis, Routine w reflex microscopic  Result Value Ref Range   Color, Urine STRAW (A) YELLOW   APPearance CLEAR CLEAR   Specific Gravity, Urine 1.003 (L) 1.005 - 1.030   pH 7.0 5.0 - 8.0   Glucose, UA NEGATIVE NEGATIVE mg/dL   Hgb urine dipstick NEGATIVE NEGATIVE   Bilirubin Urine NEGATIVE NEGATIVE   Ketones, ur NEGATIVE NEGATIVE mg/dL   Protein, ur NEGATIVE NEGATIVE mg/dL   Nitrite NEGATIVE NEGATIVE   Leukocytes,Ua NEGATIVE NEGATIVE  Hepatic function panel  Result Value  Ref Range   Total Protein 4.5 (L) 6.5 - 8.1 g/dL   Albumin 2.0 (L) 3.5 - 5.0 g/dL   AST 77 (H) 15 - 41 U/L   ALT 30 0 - 44 U/L   Alkaline Phosphatase 134 (H) 38 - 126 U/L   Total Bilirubin 1.1 0.3 - 1.2 mg/dL   Bilirubin, Direct 0.6 (H) 0.0 - 0.2 mg/dL   Indirect Bilirubin 0.5 0.3 - 0.9 mg/dL  I-stat chem 8, ED (not at [facility] or [facility])  Result Value Ref Range   Sodium 131 (L) 135 - 145 mmol/L   Potassium 3.9 3.5 - 5.1 mmol/L   Chloride 93 (L) 98 - 111 mmol/L   BUN 11 6 - 20 mg/dL   Creatinine, Ser 9.37 0.44 - 1.00 mg/dL   Glucose, Bld 88 70 - 99 mg/dL   Calcium, Ion 1.69 (L) 1.15 - 1.40 mmol/L   TCO2 31 22 - 32 mmol/L   Hemoglobin 10.9 (L) 12.0 - 15.0 g/dL   HCT 67.8 (L) 93.8 - 10.1 %  I-Stat Beta hCG blood, ED ([facility], [facility], [facility] only)  Result Value Ref Range   I-stat hCG, quantitative <5.0 <5 mIU/mL   Comment 3           DG Chest Portable 1 View  Result Date: 01/26/2020 CLINICAL DATA:  Hypotension EXAM: PORTABLE CHEST 1 VIEW COMPARISON:  None FINDINGS: Low lung volumes with streaky basilar opacities favoring atelectasis. No consolidation, features of edema, pneumothorax, or effusion. The cardiomediastinal contours are unremarkable. No acute osseous or soft tissue abnormality. Telemetry leads overlie the chest. IMPRESSION: Low lung volumes with streaky basilar opacities favoring atelectasis. Electronically Signed   By: Kreg Shropshire M.D.   On: 01/26/2020 01:31    EKG None  Radiology DG Chest Portable 1  View  Result Date: 01/26/2020 CLINICAL DATA:  Hypotension EXAM: PORTABLE CHEST 1 VIEW COMPARISON:  None FINDINGS: Low lung volumes with streaky basilar opacities favoring atelectasis. No consolidation, features of edema, pneumothorax, or effusion. The cardiomediastinal contours are unremarkable. No acute osseous or soft tissue abnormality. Telemetry leads overlie the chest. IMPRESSION: Low lung volumes with streaky basilar opacities favoring atelectasis. Electronically Signed   By: Kreg Shropshire M.D.   On: 01/26/2020 01:31    Procedures Procedures (including critical care time)  Medications Ordered in ED Medications  glucagon (human recombinant) (GLUCAGEN) injection 2 mg (2 mg Intravenous Given 01/26/20 0217)  ondansetron (ZOFRAN) injection 4 mg (4 mg Intravenous Given 01/26/20 0230)  sodium chloride 0.9 % bolus 1,000 mL (0 mLs Intravenous Stopped 01/26/20 0303)  naloxone (NARCAN) injection 1 mg (1 mg Intravenous Given 01/26/20 0303)  lactated ringers bolus 1,000 mL (1,000 mLs Intravenous New Bag/Given 01/26/20 0303)    ED Course  I have reviewed the triage vital signs and the nursing notes.  Pertinent labs & imaging results that were available during my care of the patient were reviewed by me and considered in my medical decision making (see chart for details).    Admit to medicine. This is not sepsis.  Patient has no complaints.  She HAS distal pulses in BLE.  This is polypharmacy.  Stop BP meds.  Stop librium  Final Clinical Impression(s) / ED Diagnoses Final diagnoses:  Polypharmacy  Hypotension, unspecified hypotension type   Admit to medicine .     Zyshonne Malecha, MD 01/26/20 [id number]

## 2020-01-26 NOTE — ED Notes (Signed)
Patient found on the edge of the bed, call light at bedside. Patient assisted to the RR with steady, continent with urine. New bed linens applied, patient more awake and alert, asking for food/water.

## 2020-01-26 NOTE — ED Triage Notes (Signed)
Pt complains of low blood pressure caused by detox medication, librium, pt has been on the med for 4 days for alcohol and benzo detox

## 2020-01-26 NOTE — H&P (Signed)
History and Physical    Krystal Marquez WUJ:[id number] DOB: [birth year] DOA: 01/26/2020  PCP: System, Pcp Not In  Patient coming from: Patient was brought in from [facility].  Chief Complaint: Low blood pressure.  Most of the history is obtained from the ER physician and patient's nurse and medical records as patient appears mildly confused and unable to reach family.  HPI: Krystal Marquez is a 49 y.o. female with history of hypertension, hypothyroidism alcohol and benzodiazepine abuse was taken to [facility] for detox and has been there for last 4 days was found to have low normal blood pressure and and was given her antihypertensive following which patient became hypotensive.  As per the report patient did not have any nausea vomiting diarrhea fever chills chest pain or shortness of breath.  Patient remained hypotensive and was brought to the ER.  ED Course: In the ER patient was showing a blood pressure of 80 systolic was given fluid bolus at least 2 L follow initial blood pressure improved to about 90 systolic with a MAP of 70.  At the time of my exam patient appears mildly lethargic but follows commands and moves all extremities.  Pupils are equal reacting to light.  Blood work show hemoglobin of 9 and patient's previous blood work in 2018 showed hemoglobin around 10.  Platelets are 127 which appears to be new.  Albumin is 2 sodium 131.  UA unremarkable chest x-ray unremarkable and Covid test was negative.  In the ER patient was given 2 L fluid bolus glucagon and Narcan.  Review of Systems: As per HPI, rest all negative.   Past Medical History:  Diagnosis Date  . Hypertension   . Thyroid disease     Past Surgical History:  Procedure Laterality Date  . LAPAROTOMY    . PANCREATIC PSEUDOCYST DRAINAGE       reports that she has been smoking. She uses smokeless tobacco. She reports current alcohol use. She reports current drug use.  Allergies  Allergen Reactions  .  Codeine Nausea Only    "I don't want any that are in this class of drugs"  . Hydrocodone Nausea Only    "I don't want any that are in this class of drugs"  . Oxycodone Nausea Only    "I don't want any that are in this class of drugs"  . Prochlorperazine Edisylate     "lock jaw"    Family History  Family history unknown: Yes    Prior to Admission medications   Medication Sig Start Date End Date Taking? Authorizing Provider  acetaminophen (TYLENOL) 500 MG tablet Take 1,000 mg by mouth every 6 (six) hours as needed for mild pain.   Yes [provider]  alum & mag hydroxide-simeth (MAALOX/MYLANTA) 200-200-20 MG/5ML suspension Take 10-20 mLs by mouth every 6 (six) hours as needed for indigestion or heartburn.   Yes [provider]  amLODipine (NORVASC) 5 MG tablet Take 5 mg by mouth daily.   Yes [provider]  anti-nausea (EMETROL) solution Take 30 mLs by mouth every 6 (six) hours as needed for nausea or vomiting.   Yes [provider]  bismuth subsalicylate (PEPTO BISMOL) 262 MG/15ML suspension Take 30 mLs by mouth every 6 (six) hours as needed (upset stomach).   Yes [provider]  cetirizine (ZYRTEC) 10 MG tablet Take 10 mg by mouth daily as needed for allergies.   Yes [provider]  chlordiazePOXIDE (LIBRIUM) 5 MG capsule Take 5 mg by  mouth 4 (four) times daily. x4 doses then stop   Yes [provider]  diazepam (VALIUM) 5 MG/ML injection Inject 10 mg into the vein See admin instructions. STAT for seizures, may repeat x1 in 30 seconds if seizure persists   Yes [provider]  fluticasone (FLONASE) 50 MCG/ACT nasal spray Place 2 sprays into both nostrils daily.   Yes [provider]  ibuprofen (ADVIL) 200 MG tablet Take 600 mg by mouth every 6 (six) hours as needed for fever or moderate pain.   Yes [provider]  levothyroxine (SYNTHROID) 75 MCG tablet Take 75 mcg by mouth daily before breakfast.    Yes [provider]  lisinopril (ZESTRIL) 10 MG tablet Take 10 mg by mouth daily.   Yes [provider]  loperamide (IMODIUM A-D) 2 MG tablet Take 4 mg by mouth 4 (four) times daily as needed for diarrhea or loose stools.   Yes [provider]  montelukast (SINGULAIR) 10 MG tablet Take 10 mg by mouth at bedtime.   Yes [provider]  Multiple Vitamins-Minerals (ADULT GUMMY) CHEW Chew 1 tablet by mouth daily.   Yes [provider]  ondansetron (ZOFRAN) 8 MG tablet Take 8 mg by mouth 2 (two) times daily as needed for nausea or vomiting.   Yes [provider]  PARoxetine (PAXIL) 20 MG tablet Take 20 mg by mouth daily.   Yes [provider]  thiamine 100 MG tablet Take 100 mg by mouth daily.   Yes [provider]  traZODone (DESYREL) 50 MG tablet Take 50 mg by mouth at bedtime as needed for sleep.   Yes [provider]  vortioxetine HBr (TRINTELLIX) 5 MG TABS tablet Take 5 mg by mouth daily.   Yes [provider]    Physical Exam: Constitutional: Moderately built and nourished. Vitals:   01/26/20 0330 01/26/20 0345 01/26/20 0445 01/26/20 0500  BP: (!) 80/59 (!) 84/59 105/84 90/66  Pulse: 71 70  74  Resp: 20 (!) 21 (!) 23 20  Temp:      TempSrc:      SpO2: 98% 97%  94%  Weight:      Height:       Eyes: Anicteric no pallor. ENMT: No discharge from the ears eyes nose or mouth. Neck: No neck rigidity no mass felt. Respiratory: No rhonchi or crepitations. Cardiovascular: S1-S2 heard. Abdomen: Soft nontender bowel sounds present. Musculoskeletal: No edema. Skin: No rash. Neurologic: Lethargic but answers questions moves all extremities. Psychiatric: Lethargic.   Labs on Admission: I have personally reviewed following labs and imaging studies  CBC: Recent Labs  Lab 01/26/20 0154 01/26/20 0210  WBC 5.4  --   NEUTROABS 2.2  --   HGB 9.0* 10.9*  HCT 28.1* 32.0*  MCV 103.7*  --   PLT 127*   --    Basic Metabolic Panel: Recent Labs  Lab 01/26/20 0210  NA 131*  K 3.9  CL 93*  GLUCOSE 88  BUN 11  CREATININE 0.60   GFR: Estimated Creatinine Clearance: 67.3 mL/min (by C-G formula based on SCr of 0.6 mg/dL). Liver Function Tests: Recent Labs  Lab 01/26/20 0154  AST 77*  ALT 30  ALKPHOS 134*  BILITOT 1.1  PROT 4.5*  ALBUMIN 2.0*   No results for input(s): LIPASE, AMYLASE in the last 168 hours. No results for input(s): AMMONIA in the last 168 hours. Coagulation Profile: No results for input(s): INR, PROTIME in the last 168 hours. Cardiac Enzymes:  No results for input(s): CKTOTAL, CKMB, CKMBINDEX, TROPONINI in the last 168 hours. BNP (last 3 results) No results for input(s): PROBNP in the last 8760 hours. HbA1C: No results for input(s): HGBA1C in the last 72 hours. CBG: No results for input(s): GLUCAP in the last 168 hours. Lipid Profile: No results for input(s): CHOL, HDL, LDLCALC, TRIG, CHOLHDL, LDLDIRECT in the last 72 hours. Thyroid Function Tests: No results for input(s): TSH, T4TOTAL, FREET4, T3FREE, THYROIDAB in the last 72 hours. Anemia Panel: No results for input(s): VITAMINB12, FOLATE, FERRITIN, TIBC, IRON, RETICCTPCT in the last 72 hours. Urine analysis:    Component Value Date/Time   COLORURINE STRAW (A) 01/26/2020 0220   APPEARANCEUR CLEAR 01/26/2020 0220   LABSPEC 1.003 (L) 01/26/2020 0220   PHURINE 7.0 01/26/2020 0220   GLUCOSEU NEGATIVE 01/26/2020 0220   HGBUR NEGATIVE 01/26/2020 0220   BILIRUBINUR NEGATIVE 01/26/2020 0220   KETONESUR NEGATIVE 01/26/2020 0220   PROTEINUR NEGATIVE 01/26/2020 0220   NITRITE NEGATIVE 01/26/2020 0220   LEUKOCYTESUR NEGATIVE 01/26/2020 0220   Sepsis Labs: @LABRCNTIP (procalcitonin:4,lacticidven:4) ) Recent Results (from the past 240 hour(s))  SARS Coronavirus 2 by RT PCR (hospital order, performed in [facility] hospital lab) Nasopharyngeal Nasopharyngeal Swab     Status: None   Collection Time: 01/26/20   4:20 AM   Specimen: Nasopharyngeal Swab  Result Value Ref Range Status   SARS Coronavirus 2 NEGATIVE NEGATIVE Final    Comment: (NOTE) SARS-CoV-2 target nucleic acids are NOT DETECTED. The SARS-CoV-2 RNA is generally detectable in upper and lower respiratory specimens during the acute phase of infection. The lowest concentration of SARS-CoV-2 viral copies this assay can detect is 250 copies / mL. A negative result does not preclude SARS-CoV-2 infection and should not be used as the sole basis for treatment or other patient management decisions.  A negative result may occur with improper specimen collection / handling, submission of specimen other than nasopharyngeal swab, presence of viral mutation(s) within the areas targeted by this assay, and inadequate number of viral copies (<250 copies / mL). A negative result must be combined with clinical observations, patient history, and epidemiological information. Fact Sheet for Patients:   01/28/20 Fact Sheet for Healthcare Providers: BoilerBrush.com.cy This test is not yet approved or cleared  by the https://pope.com/ FDA and has been authorized for detection and/or diagnosis of SARS-CoV-2 by FDA under an Emergency Use Authorization (EUA).  This EUA will remain in effect (meaning this test can be used) for the duration of the COVID-19 declaration under Section 564(b)(1) of the Act, 21 U.S.C. section 360bbb-3(b)(1), unless the authorization is terminated or revoked sooner. Performed at [facility], [address] [place], [place]      Radiological Exams on Admission: DG Chest Portable 1 View  Result Date: 01/26/2020 CLINICAL DATA:  Hypotension EXAM: PORTABLE CHEST 1 VIEW COMPARISON:  None FINDINGS: Low lung volumes with streaky basilar opacities favoring atelectasis. No consolidation, features of edema, pneumothorax, or effusion. The cardiomediastinal  contours are unremarkable. No acute osseous or soft tissue abnormality. Telemetry leads overlie the chest. IMPRESSION: Low lung volumes with streaky basilar opacities favoring atelectasis. Electronically Signed   By: 01/28/2020 M.D.   On: 01/26/2020 01:31    Assessment/Plan Principal Problem:   Hypotension    1. Hypotension suspect likely from medication.  Patient was given antihypertensives despite being a low normal blood pressure and patient was on Librium protocol.  Patient does not appear septic at this time I ordered repeat labs  including metabolic panel CBC to make sure there is no further worsening of her hemoglobin and platelets and also checking blood cultures lactic acid and procalcitonin troponin EKG.  Continue with aggressive hydration for now. 2. Acute encephalopathy suspect medication related.  Since patient has history of alcohol abuse per the report will check ammonia levels thiamine level CT head.  We will keep patient on IV thiamine after blood draw is done. 3. History of hypertension presently hypotensive holding antihypertensives. 4. History of hypothyroidism we will keep patient IV Synthroid for now.  Check TSH. 5. Anemia appears to be chronic check anemia panel follow CBC.  Type and screen. 6. Thrombocytopenia appears to be new.  Follow CBC. 7. History of alcohol abuse with low albumin concerning for possible cirrhosis.  Will need further work-up.  Given the hypotension mental status changes will need close monitoring for any further deterioration in inpatient status.   DVT prophylaxis: SCDs for now until we get the CT head. Code Status: Full code. Family Communication: I try to reach patient's sister the contact number which was provided in the chart unable to reach. Disposition Plan: To be determined. Consults called: None. Admission status: Inpatient.   Rise Patience MD [facility] Pager [phone].  If 7PM-7AM, please contact  night-coverage [email or web address] Password [email or web address]  01/26/2020, 5:50 AM

## 2020-01-27 DIAGNOSIS — G9341 Metabolic encephalopathy: Secondary | ICD-10-CM

## 2020-01-27 DIAGNOSIS — F1010 Alcohol abuse, uncomplicated: Secondary | ICD-10-CM

## 2020-01-27 DIAGNOSIS — R7989 Other specified abnormal findings of blood chemistry: Secondary | ICD-10-CM

## 2020-01-27 DIAGNOSIS — D696 Thrombocytopenia, unspecified: Secondary | ICD-10-CM

## 2020-01-27 DIAGNOSIS — K760 Fatty (change of) liver, not elsewhere classified: Secondary | ICD-10-CM | POA: Diagnosis present

## 2020-01-27 DIAGNOSIS — E039 Hypothyroidism, unspecified: Secondary | ICD-10-CM

## 2020-01-27 DIAGNOSIS — R160 Hepatomegaly, not elsewhere classified: Secondary | ICD-10-CM | POA: Diagnosis present

## 2020-01-27 LAB — BILIRUBIN, TOTAL: Total Bilirubin: <0.1 mg/dL — ABNORMAL LOW (ref 0.3–1.2)

## 2020-01-27 LAB — CBC
HCT: 30.7 % — ABNORMAL LOW (ref 36.0–46.0)
Hemoglobin: 9.6 g/dL — ABNORMAL LOW (ref 12.0–15.0)
MCH: 33.3 pg (ref 26.0–34.0)
MCHC: 31.3 g/dL (ref 30.0–36.0)
MCV: 106.6 fL — ABNORMAL HIGH (ref 80.0–100.0)
Platelets: 140 10*3/uL — ABNORMAL LOW (ref 150–400)
RBC: 2.88 MIL/uL — ABNORMAL LOW (ref 3.87–5.11)
RDW: 17.9 % — ABNORMAL HIGH (ref 11.5–15.5)
WBC: 5.2 10*3/uL (ref 4.0–10.5)
nRBC: 0 % (ref 0.0–0.2)

## 2020-01-27 LAB — GLUCOSE, CAPILLARY
Glucose-Capillary: 120 mg/dL — ABNORMAL HIGH (ref 70–99)
Glucose-Capillary: 72 mg/dL (ref 70–99)
Glucose-Capillary: 80 mg/dL (ref 70–99)
Glucose-Capillary: 88 mg/dL (ref 70–99)
Glucose-Capillary: 97 mg/dL (ref 70–99)
Glucose-Capillary: 97 mg/dL (ref 70–99)

## 2020-01-27 LAB — COMPREHENSIVE METABOLIC PANEL
ALT: 27 U/L (ref 0–44)
AST: 47 U/L — ABNORMAL HIGH (ref 15–41)
Albumin: 1.8 g/dL — ABNORMAL LOW (ref 3.5–5.0)
Alkaline Phosphatase: 115 U/L (ref 38–126)
Anion gap: 9 (ref 5–15)
BUN: 7 mg/dL (ref 6–20)
CO2: 22 mmol/L (ref 22–32)
Calcium: 7.5 mg/dL — ABNORMAL LOW (ref 8.9–10.3)
Chloride: 108 mmol/L (ref 98–111)
Creatinine, Ser: 0.4 mg/dL — ABNORMAL LOW (ref 0.44–1.00)
GFR calc Af Amer: >60 mL/min (ref >60)
GFR calc non Af Amer: >60 mL/min (ref >60)
Glucose, Bld: 83 mg/dL (ref 70–99)
Potassium: 3.6 mmol/L (ref 3.5–5.1)
Sodium: 139 mmol/L (ref 135–145)
Total Bilirubin: 0.6 mg/dL (ref 0.3–1.2)
Total Protein: 4.3 g/dL — ABNORMAL LOW (ref 6.5–8.1)

## 2020-01-27 LAB — MAGNESIUM: Magnesium: 1.8 mg/dL (ref 1.7–2.4)

## 2020-01-27 MED ORDER — THIAMINE HCL 100 MG PO TABS
100.0000 mg | ORAL_TABLET | Freq: Every day | ORAL | Status: DC
  Administered 2020-01-28: 100 mg via ORAL
  Filled 2020-01-27: qty 1

## 2020-01-27 NOTE — Discharge Summary (Signed)
Krystal Marquez PYP:[id number] DOB: Oct 15, 1970 DOA: 01/26/2020  PCP: System, Pcp Not In  Admit date: 01/26/2020 Discharge date: 01/28/2020  Admitted From: [facility] Disposition: [facility]  Recommendations for Outpatient Follow-up:  1. Follow up with PCP in 1-2 weeks 2. New medication changes: Continue to hold Lisinopril and amlodipine with daily blood pressure monitoring.  Lactulose new medication, Foley acid 4 deficiency 3. Needs repeat TSH in 4 to 6 weeks as outpatient 4. Likely need GI referral given.  Likely cirrhosis right upper quadrant ultrasound shows hepatic steatosis/hepatomegaly 5. Needs monitoring LFTs, platelets as outpatient    Home Health: None Equipment/Devices: None Discharge Condition: Stable CODE STATUS: Full   Brief/Interim Summary: History of present illness:  [name] is a 49 year old female with medical history significant for HTN, hypothyroidism,alcohol andbenzodiazepine abuse who was undergoing detox at [facility] for the past 4 days and was found to have low normal blood pressure. Patient was reportedly given her antihypertensives and became hypotensive with SBP in the 60s. In the ED patient wass noted to have SBP in the 70swith map of 61 and no lactic acidosis and required 2 L of lactated ringer boluses with initiation of maintenance fluids. UDS positive for benzodiazepines, TSH 12, ammonia 60, procalcitonin less than 0.10, hemoglobin 9. CT head showed no acute findings but did show premature generalized atrophy. Chest x-ray favored atelectasis. For  TRH was called for admission with hypotension presumed secondary to antihypertensive regimen while on Librium protocol for alcohol withdrawal. Patient's blood pressure continued to improve while holding her home amlodipine and lisinopril.  Blood cultures were unremarkable, and patient did not require antibiotics.  Patient was found to have elevated  ammonia in the setting of encephalopathy and asterixis presumed hepatic encephalopathy for which she responded to lactulose therapy.  Of note right upper quadrant ultrasound only showed hepatic steatosis with hepatomegaly and no overt signs of cirrhosis.  Patient also found to have elevated TSH, suspect related to poor adherence (either not taking, or taking Synthroid with other medications).  Her Synthroid was continued here and she will need repeat TSH in 4 to 6 weeks as outpatient.  Right upper quadrant ultrasound did show borderline dilated common bile duct with cholelithiasis without acute cholecystitis findings.  Patient had no abdominal pain, nausea or vomiting.  AST was slightly elevated but ALT and T bili within normal limits.   Remaining hospital course addressed in problem based format below:   Hospital Course:   Hypotension, improving.Likely related to her continued antihypertensive regimen while on Librium protocol for alcohol detox.   No longer requiring IV fluids, has maintained blood pressure in the 110s-120s systolically with adequate maps and no orthostatic hypotension.   Doubt infection given unremarkable procalcitonin, no localizing symptoms, no acute findings on imaging, unremarkable blood cultures. -Hold prior BP regimen of lisinopril, amlodipine given not currently hypertensive  Acute metabolic encephalopathy, suspect hepatic encephalopathy, improved.Somnolent and lethargicearlier this hospital stay, questionable hepatic encephalopathy given elevated ammonia in the 60s with hepatic steatosis on right upper quadrant ultrasound, some slight asterixis on exam but alert and oriented x4 and having appropriate 3-4 BMs daily -Continue lactulose 10 mg 3 times daily, goal at least 3 BMs per day -Avoid sedatives, continue holding Valium -Oral thiamine, pending thiamine levels  Hypothyroidism. Suspect poor adherence given TSH elevated at greater than 12. Do not suspect  hypotension related to hypothyroidism as blood pressure has improved, has no bradycardia -Continue oral Synthroid. -Need repeat TSH in 4 to  6 weeks as outpatient  Thrombocytopenia, elevated ammonia, alcohol abuse, high concern for cirrhosis in setting of alcohol abuse Right upper quadrant ultrasound actually showed hepatomegaly with hepatic steatosis. Platelets 140 on discharge.  Small volume abdominal ascites noted on right upper quadrant ultrasound.  Had no signs or symptoms of bleeding during hospital stay.  Ammonia elevated at 60 on admission and treated for hepatic encephalopathy. No ascites on physical exam  -Advised continued cessation of alcohol -Lifestyle changes -Lactulose as mentioned above -We will likely need close follow-up with PCP, and likely GI referral  Macrocytic anemia.  Likely nutritional deficiency related to alcohol abuse with folate deficiency. B12 within normal limits, folate low -Continue folate 1 mg daily -continue to encourage cessation of alcohol use  Borderline dilated common bile duct.  Noted on right upper quadrant ultrasound with cholelithiasis without acute cholecystitis.  Without nausea, no recurrent abdominal pain, AST slightly elevated (46), T bili within normal limits.  Tolerating diet, no fevers, no further work-up -Continue to closely monitor  History of alcohol abuse.Was undergoing detox prior to admission.No  signs of withdrawal  during hospitalization -Patient agreeable to return to [facility] on discharge -continue thiamine, multivitamin  Depression, stable -Prior to mission taking Trintellix and paroxetine, odd combination (SNRI and SSRI) but would advise continuing to avoid any withdrawal symptoms  Consultations:  None  Procedures/Studies: Right upper quadrant ultrasound, 5/26  1. There is cholelithiasis without secondary signs of acute cholecystitis. 2. Borderline dilated common bile duct measuring up to  approximately 7 mm. Correlation with laboratory studies is recommended. 3. Hepatomegaly with likely underlying hepatic steatosis. 4. Small volume abdominal ascites Subjective: Feels well.  No abdominal pain.  No nausea or vomiting.  No chest pain.  No shortness of breath.  No fevers or chills. Discharge Exam: Vitals:   01/28/20 0429 01/28/20 1144  BP: 127/86 130/88  Pulse: 79 78  Resp: 18 20  Temp: 98 F (36.7 C) 98.2 F (36.8 C)  SpO2: 97% 99%   Vitals:   01/27/20 2032 01/28/20 0012 01/28/20 0429 01/28/20 1144  BP: 120/87 118/86 127/86 130/88  Pulse: 86 80 79 78  Resp: 20 20 18 20   Temp: 100.2 F (37.9 C) 98.9 F (37.2 C) 98 F (36.7 C) 98.2 F (36.8 C)  TempSrc: Oral Oral Oral Oral  SpO2: 97% 96% 97% 99%  Weight:      Height:       Female who looks older than stated age, in no distress No jaundice Regular rate and rhythm, no peripheral edema Abdomen soft, nondistended, nontender, normal bowel sounds Slight asterixis present, alert and oriented x4, normal speech   Discharge Diagnoses:  Principal Problem:   Hypotension Active Problems:   Increased ammonia level   Acute hepatic encephalopathy   Thrombocytopenia (HCC)   Elevated LFTs   Hypothyroidism   Hepatic steatosis   Hepatomegaly   Alcohol abuse   Encephalopathy due to ammonia   Macrocytic anemia   Folate deficiency    Discharge Instructions  Discharge Instructions    Diet - low sodium heart healthy   Complete by: As directed    Increase activity slowly   Complete by: As directed      Allergies as of 01/28/2020      Reactions   Codeine Nausea Only   "I don't want any that are in this class of drugs"   Hydrocodone Nausea Only   "I don't want any that are in this class of drugs"   Oxycodone  Nausea Only   "I don't want any that are in this class of drugs"   Prochlorperazine Edisylate    "lock jaw"      Medication List    STOP taking these medications   acetaminophen 500 MG  tablet Commonly known as: TYLENOL   amLODipine 5 MG tablet Commonly known as: NORVASC   chlordiazePOXIDE 5 MG capsule Commonly known as: LIBRIUM   lisinopril 10 MG tablet Commonly known as: ZESTRIL     TAKE these medications   Adult Gummy Chew Chew 1 tablet by mouth daily.   alum & mag hydroxide-simeth 200-200-20 MG/5ML suspension Commonly known as: MAALOX/MYLANTA Take 10-20 mLs by mouth every 6 (six) hours as needed for indigestion or heartburn.   anti-nausea solution Take 30 mLs by mouth every 6 (six) hours as needed for nausea or vomiting.   bismuth subsalicylate 409 WJ/19JY suspension Commonly known as: PEPTO BISMOL Take 30 mLs by mouth every 6 (six) hours as needed (upset stomach).   cetirizine 10 MG tablet Commonly known as: ZYRTEC Take 10 mg by mouth daily as needed for allergies.   diazepam 5 MG/ML injection Commonly known as: VALIUM Inject 10 mg into the vein See admin instructions. STAT for seizures, may repeat x1 in 30 seconds if seizure persists   fluticasone 50 MCG/ACT nasal spray Commonly known as: FLONASE Place 2 sprays into both nostrils daily.   folic acid 1 MG tablet Commonly known as: FOLVITE Take 1 tablet (1 mg total) by mouth daily. Start taking on: Jan 29, 2020   ibuprofen 200 MG tablet Commonly known as: ADVIL Take 600 mg by mouth every 6 (six) hours as needed for fever or moderate pain.   lactulose 10 GM/15ML solution Commonly known as: CHRONULAC Take 15 mLs (10 g total) by mouth 3 (three) times daily.   levothyroxine 75 MCG tablet Commonly known as: SYNTHROID Take 75 mcg by mouth daily before breakfast.   loperamide 2 MG tablet Commonly known as: IMODIUM A-D Take 4 mg by mouth 4 (four) times daily as needed for diarrhea or loose stools.   montelukast 10 MG tablet Commonly known as: SINGULAIR Take 10 mg by mouth at bedtime.   ondansetron 8 MG tablet Commonly known as: ZOFRAN Take 8 mg by mouth 2 (two) times daily as needed for  nausea or vomiting.   PARoxetine 20 MG tablet Commonly known as: PAXIL Take 20 mg by mouth daily.   thiamine 100 MG tablet Take 100 mg by mouth daily.   traZODone 50 MG tablet Commonly known as: DESYREL Take 50 mg by mouth at bedtime as needed for sleep.   Trintellix 5 MG Tabs tablet Generic drug: vortioxetine HBr Take 5 mg by mouth daily.       Allergies  Allergen Reactions  . Codeine Nausea Only    "I don't want any that are in this class of drugs"  . Hydrocodone Nausea Only    "I don't want any that are in this class of drugs"  . Oxycodone Nausea Only    "I don't want any that are in this class of drugs"  . Prochlorperazine Edisylate     "lock jaw"        The results of significant diagnostics from this hospitalization (including imaging, microbiology, ancillary and laboratory) are listed below for reference.     Microbiology: Recent Results (from the past 240 hour(s))  SARS Coronavirus 2 by RT PCR (hospital order, performed in [facility] hospital lab) Nasopharyngeal Nasopharyngeal Swab  Status: None   Collection Time: 01/26/20  4:20 AM   Specimen: Nasopharyngeal Swab  Result Value Ref Range Status   SARS Coronavirus 2 NEGATIVE NEGATIVE Final    Comment: (NOTE) SARS-CoV-2 target nucleic acids are NOT DETECTED. The SARS-CoV-2 RNA is generally detectable in upper and lower respiratory specimens during the acute phase of infection. The lowest concentration of SARS-CoV-2 viral copies this assay can detect is 250 copies / mL. A negative result does not preclude SARS-CoV-2 infection and should not be used as the sole basis for treatment or other patient management decisions.  A negative result may occur with improper specimen collection / handling, submission of specimen other than nasopharyngeal swab, presence of viral mutation(s) within the areas targeted by this assay, and inadequate number of viral copies (<250 copies / mL). A negative result must be  combined with clinical observations, patient history, and epidemiological information. Fact Sheet for Patients:   BoilerBrush.com.cy Fact Sheet for Healthcare Providers: https://pope.com/ This test is not yet approved or cleared  by the [place] FDA and has been authorized for detection and/or diagnosis of SARS-CoV-2 by FDA under an Emergency Use Authorization (EUA).  This EUA will remain in effect (meaning this test can be used) for the duration of the COVID-19 declaration under Section 564(b)(1) of the Act, 21 U.S.C. section 360bbb-3(b)(1), unless the authorization is terminated or revoked sooner. Performed at [facility], [address] [place], [place] [address]   Culture, blood (routine x 2)     Status: None (Preliminary result)   Collection Time: 01/26/20  7:01 AM   Specimen: BLOOD  Result Value Ref Range Status   Specimen Description   Final    BLOOD BLOOD LEFT FOREARM Performed at [facility], [address] [place], [place] [address]    Special Requests   Final    BOTTLES DRAWN AEROBIC AND ANAEROBIC Blood Culture results may not be optimal due to an inadequate volume of blood received in culture bottles Performed at [facility], [address], [place], [place] [address]    Culture   Final    NO GROWTH 2 DAYS Performed at [facility], [address], [place], [place] [address]    Report Status PENDING  Incomplete  Culture, blood (routine x 2)     Status: None (Preliminary result)   Collection Time: 01/26/20 10:33 AM   Specimen: BLOOD  Result Value Ref Range Status   Specimen Description   Final    BLOOD RIGHT ANTECUBITAL Performed at [facility], [address], [place], [place] [address]    Special Requests   Final    BOTTLES DRAWN AEROBIC AND ANAEROBIC Blood Culture adequate volume Performed at [facility], [address], [place], [place] [address]    Culture   Final    NO GROWTH 2 DAYS Performed at [facility], [address] [place], [place] [address]    Report Status PENDING  Incomplete     Labs: BNP (last 3 results) No results for input(s): BNP in the last 8760 hours. Basic Metabolic Panel: Recent Labs  Lab 01/26/20 0210 01/26/20 0701 01/27/20 0357 01/27/20 0401 01/28/20 0348  NA 131* 134*  --  139 136  K 3.9 3.6  --  3.6 3.5  CL 93* 103  --  108 108  CO2  --  24  --  22 23  GLUCOSE 88 84  --  83  111*  BUN 11 10  --  7 <5*  CREATININE 0.60 0.57  --  0.40* 0.44  CALCIUM  --  7.5*  --  7.5* 7.7*  MG  --   --  1.8  --   --    Liver Function Tests: Recent Labs  Lab 01/26/20 0154 01/26/20 0701 01/27/20 0401 01/28/20 0348  AST 77* 53* 47* 46*  ALT 30 28 27 27   ALKPHOS 134* 118 115 108  BILITOT 1.1 0.3 <0.1*  0.6 0.3  PROT 4.5* 4.5* 4.3* 4.3*  ALBUMIN 2.0* 1.9* 1.8* 1.9*   No results for input(s): LIPASE, AMYLASE in the last 168 hours. Recent Labs  Lab 01/26/20 0701  AMMONIA 60*   CBC: Recent Labs  Lab 01/26/20 0154 01/26/20 0210 01/26/20 0701 01/27/20 0401  WBC 5.4  --  5.1 5.2  NEUTROABS 2.2  --  2.7  --   HGB 9.0* 10.9* 10.0* 9.6*  HCT 28.1* 32.0* 30.9* 30.7*  MCV 103.7*  --  104.7* 106.6*  PLT 127*  --  101* 140*   Cardiac Enzymes: No results for input(s): CKTOTAL, CKMB, CKMBINDEX, TROPONINI in the last 168 hours. BNP: Invalid input(s): POCBNP CBG: Recent Labs  Lab 01/27/20 2034 01/28/20 0016 01/28/20 0431 01/28/20 0747 01/28/20 1140  GLUCAP 120* 106* 104* 102* 102*   D-Dimer No results for input(s): DDIMER in the last 72 hours. Hgb A1c No results for input(s): HGBA1C in the last 72 hours. Lipid Profile No results for input(s): CHOL, HDL, LDLCALC, TRIG, CHOLHDL, LDLDIRECT in the last 72 hours. Thyroid function studies Recent Labs    01/26/20 0701  TSH 12.164*   Anemia work up Recent Labs    01/26/20 0701   VITAMINB12 792  FOLATE 3.3*  FERRITIN 23  TIBC 159*  IRON 39  RETICCTPCT 2.4   Urinalysis    Component Value Date/Time   COLORURINE STRAW (A) 01/26/2020 0220   APPEARANCEUR CLEAR 01/26/2020 0220   LABSPEC 1.003 (L) 01/26/2020 0220   PHURINE 7.0 01/26/2020 0220   GLUCOSEU NEGATIVE 01/26/2020 0220   HGBUR NEGATIVE 01/26/2020 0220   BILIRUBINUR NEGATIVE 01/26/2020 0220   KETONESUR NEGATIVE 01/26/2020 0220   PROTEINUR NEGATIVE 01/26/2020 0220   NITRITE NEGATIVE 01/26/2020 0220   LEUKOCYTESUR NEGATIVE 01/26/2020 0220   Sepsis Labs Invalid input(s): PROCALCITONIN,  WBC,  LACTICIDVEN Microbiology Recent Results (from the past 240 hour(s))  SARS Coronavirus 2 by RT PCR (hospital order, performed in [facility] hospital lab) Nasopharyngeal Nasopharyngeal Swab     Status: None   Collection Time: 01/26/20  4:20 AM   Specimen: Nasopharyngeal Swab  Result Value Ref Range Status   SARS Coronavirus 2 NEGATIVE NEGATIVE Final    Comment: (NOTE) SARS-CoV-2 target nucleic acids are NOT DETECTED. The SARS-CoV-2 RNA is generally detectable in upper and lower respiratory specimens during the acute phase of infection. The lowest concentration of SARS-CoV-2 viral copies this assay can detect is 250 copies / mL. A negative result does not preclude SARS-CoV-2 infection and should not be used as the sole basis for treatment or other patient management decisions.  A negative result may occur with improper specimen collection / handling, submission of specimen other than nasopharyngeal swab, presence of viral mutation(s) within the areas targeted by this assay, and inadequate number of viral copies (<250 copies / mL). A negative result must be combined with clinical observations, patient history, and epidemiological information. Fact Sheet for Patients:   01/28/20 Fact Sheet for Healthcare Providers: BoilerBrush.com.cy This test is not  yet  approved or cleared  by the [place] and has been authorized for detection and/or diagnosis of SARS-CoV-2 by FDA under an Emergency Use Authorization (EUA).  This EUA will remain in effect (meaning this test can be used) for the duration of the COVID-19 declaration under Section 564(b)(1) of the Act, 21 U.S.C. section 360bbb-3(b)(1), unless the authorization is terminated or revoked sooner. Performed at [facility], [address], [place], [place] [address]   Culture, blood (routine x 2)     Status: None (Preliminary result)   Collection Time: 01/26/20  7:01 AM   Specimen: BLOOD  Result Value Ref Range Status   Specimen Description   Final    BLOOD BLOOD LEFT FOREARM Performed at [facility], [address] [place], [place] [address]    Special Requests   Final    BOTTLES DRAWN AEROBIC AND ANAEROBIC Blood Culture results may not be optimal due to an inadequate volume of blood received in culture bottles Performed at [facility], [address], [place], [place] [address]    Culture   Final    NO GROWTH 2 DAYS Performed at [facility], [address] [place], [place] [address]    Report Status PENDING  Incomplete  Culture, blood (routine x 2)     Status: None (Preliminary result)   Collection Time: 01/26/20 10:33 AM   Specimen: BLOOD  Result Value Ref Range Status   Specimen Description   Final    BLOOD RIGHT ANTECUBITAL Performed at [facility] [address], [place], [place] [address]    Special Requests   Final    BOTTLES DRAWN AEROBIC AND ANAEROBIC Blood Culture adequate volume Performed at [facility], [address] [place], [place] [address]    Culture   Final    NO GROWTH 2 DAYS Performed at [facility], [address] [place], [place] [address]    Report Status PENDING  Incomplete     Time coordinating discharge: Over 30  minutes  SIGNED:   Laverna Peace, MD  [facility] 01/28/2020, 4:23 PM Pager   If 7PM-7AM, please contact night-coverage [email or web address] Password [email or web address]

## 2020-01-27 NOTE — TOC Progression Note (Signed)
Transition of Care [facility]) - Progression Note    Patient Details  Name: Krystal Marquez MRN: [id number] Date of Birth: 1971-04-12  Transition of Care [facility]) CM/SW Contact  Geni Bers, RN Phone Number: 01/27/2020, 3:42 PM  Clinical Narrative:     [facility] was [facility] to see if pt could return. Left VM for Director Andrey Campanile, RN with no return answer. Spoke with Lanice Schwab, RN who states that discharge summary, MD notes, Labs and medications will need to be faxed to [phone]. TOC will continue to follow.        Expected Discharge Plan and Services                                                 Social Determinants of Health (SDOH) Interventions    Readmission Risk Interventions No flowsheet data found.

## 2020-01-27 NOTE — Progress Notes (Signed)
[facility]  PROGRESS NOTE  Ilissa Rosner WUJ:[id number] DOB: [birth year] DOA: 01/26/2020 PCP: System, Pcp Not In Admit date - 01/26/2020   Admitting Physician Rise Patience, MD  Outpatient Primary MD for the patient is System, Pcp Not In  LOS - 1 Brief Narrative  [name] is a 49 year old female with medical history significant for HTN, hypothyroidism, alcohol and benzodiazepine abuse who was undergoing detox at [facility] for the past 4 days and was found to have low normal blood pressure.  Patient was reportedly given her antihypertensives and became hypotensive with SBP in the 60s.  In the ED patient wass noted to have SBP in the 70s with map of 61 and no lactic acidosis and required 2 L of lactated ringer boluses with initiation of maintenance fluids.  UDS positive for benzodiazepines, TSH 12, ammonia 60, procalcitonin less than 0.10, hemoglobin 9.  CT head showed no acute findings but did show premature generalized atrophy.  Chest x-ray favored atelectasis.  For  TRH was called for admission with hypotension presumed secondary to antihypertensive regimen while on Librium protocol for alcohol withdrawal.    Subjective  Today she reports having BMs without abdominal pain. No nausea or vomiting. No fevers or chills. No chest pain or SOB  A & P   Hypotension, improving.  Likely related to her continued antihypertensive regimen while on Librium protocol for alcohol detox.    No longer requiring IV fluids, has maintained blood pressure in the 956O-130Q systolically with adequate maps and no orthostatic hypotension.  SBP in the 90s - low 100s while on IV fluids with MAP in 80s currently .  Doubt infection given unremarkable procalcitonin, no localizing symptoms, no acute findings on imaging, unremarkable blood cultures. -Maintain normal blood pressure off IV fluids -Still holding home regimen of lisinopril, amlodipine given not currently hypertensive  Acute  metabolic encephalopathy, improved.  Somnolent and lethargic earlier this hospital stay, questionable hepatic encephalopathy given elevated ammonia in the 60s with hepatic steatosis on right upper quadrant ultrasound, with no evidence of cirrhosis, still has some slight asterixis on exam -Continue lactulose 10 mg 3 times daily, goal at least 3 BMs per day -Delirium precautions -Avoid sedatives, holding home Valium -Transition to oral thiamine, pending thiamine levels  Hypothyroidism.  Suspect poor adherence given TSH elevated at greater than 12.  Do not suspect hypotension related to hypothyroidism as blood pressure has improved, has no bradycardia -Continue oral Synthroid. -Need repeat TSH in 4 to 6 weeks as outpatient  Thrombocytopenia, elevated ammonia, alcohol abuse, high concern for cirrhosis Right upper quadrant ultrasound actually showed hepatomegaly with hepatic steatosis -Advised continued cessation of alcohol -Lifestyle changes -Monitoring daily CBC -Lactulose as mentioned above  Borderline dilated common bile duct.  Noted on right upper quadrant ultrasound with cholelithiasis without acute cholecystitis.  Without nausea, no recurrent abdominal pain, AST slightly elevated T bili within normal limits. -Continue to closely monitor  History of alcohol abuse.  Was undergoing detox prior to admission.  No current signs of withdrawal currently outside window, last drink upwards of a week ago     Family Communication  : updated sister over phone today  Code Status : Full code  Disposition Plan  :  Patient is from [facility] addiction treatment center. Anticipated d/c date: 4 to 48 hours. Barriers to d/c or necessity for inpatient status:  Patient medically stable for discharge, currently ranging transition back to [facility] with help of case management Consults  : None  Procedures  : Right upper quadrant ultrasound, 5/26  DVT Prophylaxis  : SCDs  Lab Results   Component Value Date   PLT 140 (L) 01/27/2020    Diet :  Diet Order            Diet regular Room service appropriate? Yes; Fluid consistency: Thin  Diet effective now               Inpatient Medications Scheduled Meds: . lactulose  10 g Oral TID  . levothyroxine  75 mcg Oral Q0600  . thiamine injection  100 mg Intravenous Daily   Continuous Infusions: PRN Meds:.ondansetron **OR** ondansetron (ZOFRAN) IV  Antibiotics  :   Anti-infectives (From admission, onward)   None       Objective   Vitals:   01/26/20 2337 01/27/20 0341 01/27/20 0734 01/27/20 1200  BP: (!) 122/91 123/86 108/73 119/84  Pulse: 75 80 73 83  Resp: 20 18 18 20   Temp: 98.3 F (36.8 C) 98 F (36.7 C) 98.4 F (36.9 C) 99 F (37.2 C)  TempSrc: Oral Oral Oral Oral  SpO2: 96% 96% 97% 98%  Weight:      Height:        SpO2: 98 %  Wt Readings from Last 3 Encounters:  01/26/20 52.2 kg     Intake/Output Summary (Last 24 hours) at 01/27/2020 1815 Last data filed at 01/27/2020 1300 Gross per 24 hour  Intake 1843.2 ml  Output --  Net 1843.2 ml    Physical Exam:   Awake Alert, Oriented X 3, Normal affect No new F.N deficits,  [place].AT, Normal respiratory effort on room air, CTAB RRR,No Gallops,Rubs or new Murmurs,  +ve B.Sounds, Abd Soft, No tenderness, No rebound, guarding or rigidity. No Cyanosis, No new Rash or bruise Mild asterixis present   I have personally reviewed the following:   Data Reviewed:  CBC Recent Labs  Lab 01/26/20 0154 01/26/20 0210 01/26/20 0701 01/27/20 0401  WBC 5.4  --  5.1 5.2  HGB 9.0* 10.9* 10.0* 9.6*  HCT 28.1* 32.0* 30.9* 30.7*  PLT 127*  --  101* 140*  MCV 103.7*  --  104.7* 106.6*  MCH 33.2  --  33.9 33.3  MCHC 32.0  --  32.4 31.3  RDW 17.7*  --  17.9* 17.9*  LYMPHSABS 2.2  --  1.7  --   MONOABS 0.7  --  0.6  --   EOSABS 0.2  --  0.1  --   BASOSABS 0.0  --  0.0  --     Chemistries  Recent Labs  Lab 01/26/20 0154 01/26/20 0210  01/26/20 0701 01/27/20 0357 01/27/20 0401  NA  --  131* 134*  --  139  K  --  3.9 3.6  --  3.6  CL  --  93* 103  --  108  CO2  --   --  24  --  22  GLUCOSE  --  88 84  --  83  BUN  --  11 10  --  7  CREATININE  --  0.60 0.57  --  0.40*  CALCIUM  --   --  7.5*  --  7.5*  MG  --   --   --  1.8  --   AST 77*  --  53*  --  47*  ALT 30  --  28  --  27  ALKPHOS 134*  --  118  --  115  BILITOT 1.1  --  0.3  --  <0.1*  0.6   ------------------------------------------------------------------------------------------------------------------ No results for input(s): CHOL, HDL, LDLCALC, TRIG, CHOLHDL, LDLDIRECT in the last 72 hours.  No results found for: HGBA1C ------------------------------------------------------------------------------------------------------------------ Recent Labs    01/26/20 0701  TSH 12.164*   ------------------------------------------------------------------------------------------------------------------ Recent Labs    01/26/20 0701  VITAMINB12 792  FOLATE 3.3*  FERRITIN 23  TIBC 159*  IRON 39  RETICCTPCT 2.4    Coagulation profile No results for input(s): INR, PROTIME in the last 168 hours.  No results for input(s): DDIMER in the last 72 hours.  Cardiac Enzymes No results for input(s): CKMB, TROPONINI, MYOGLOBIN in the last 168 hours.  Invalid input(s): CK ------------------------------------------------------------------------------------------------------------------ No results found for: BNP  Micro Results Recent Results (from the past 240 hour(s))  SARS Coronavirus 2 by RT PCR (hospital order, performed in [facility] hospital lab) Nasopharyngeal Nasopharyngeal Swab     Status: None   Collection Time: 01/26/20  4:20 AM   Specimen: Nasopharyngeal Swab  Result Value Ref Range Status   SARS Coronavirus 2 NEGATIVE NEGATIVE Final    Comment: (NOTE) SARS-CoV-2 target nucleic acids are NOT DETECTED. The SARS-CoV-2 RNA is generally detectable  in upper and lower respiratory specimens during the acute phase of infection. The lowest concentration of SARS-CoV-2 viral copies this assay can detect is 250 copies / mL. A negative result does not preclude SARS-CoV-2 infection and should not be used as the sole basis for treatment or other patient management decisions.  A negative result may occur with improper specimen collection / handling, submission of specimen other than nasopharyngeal swab, presence of viral mutation(s) within the areas targeted by this assay, and inadequate number of viral copies (<250 copies / mL). A negative result must be combined with clinical observations, patient history, and epidemiological information. Fact Sheet for Patients:   BoilerBrush.com.cy Fact Sheet for Healthcare Providers: https://pope.com/ This test is not yet approved or cleared  by the [place] FDA and has been authorized for detection and/or diagnosis of SARS-CoV-2 by FDA under an Emergency Use Authorization (EUA).  This EUA will remain in effect (meaning this test can be used) for the duration of the COVID-19 declaration under Section 564(b)(1) of the Act, 21 U.S.C. section 360bbb-3(b)(1), unless the authorization is terminated or revoked sooner. Performed at [facility], [address], [place], [place] [address]   Culture, blood (routine x 2)     Status: None (Preliminary result)   Collection Time: 01/26/20  7:01 AM   Specimen: BLOOD  Result Value Ref Range Status   Specimen Description   Final    BLOOD BLOOD LEFT FOREARM Performed at [facility], [address] [place], [place] [address]    Special Requests   Final    BOTTLES DRAWN AEROBIC AND ANAEROBIC Blood Culture results may not be optimal due to an inadequate volume of blood received in culture bottles Performed at [facility], [address], [place], [place]  [address]    Culture   Final    NO GROWTH 1 DAY Performed at [facility], [address], [place], [place] [address]    Report Status PENDING  Incomplete  Culture, blood (routine x 2)     Status: None (Preliminary result)   Collection Time: 01/26/20 10:33 AM   Specimen: BLOOD  Result Value Ref Range Status   Specimen Description   Final    BLOOD RIGHT ANTECUBITAL Performed at [facility], [address] [place], [place] [address]    Special Requests   Final    BOTTLES DRAWN AEROBIC AND ANAEROBIC Blood Culture adequate volume Performed at [facility], [address], [place], [place] [address]    Culture   Final    NO GROWTH < 24 HOURS Performed at [facility], [address], [place], [place] [address]    Report Status PENDING  Incomplete    Radiology Reports CT HEAD WO CONTRAST  Result Date: 01/26/2020 CLINICAL DATA:  Encephalopathy.  Undergoing detox. EXAM: CT HEAD WITHOUT CONTRAST TECHNIQUE: Contiguous axial images were obtained from the base of the skull through the vertex without intravenous contrast. COMPARISON:  None. FINDINGS: Brain: No evidence of acute infarction, hemorrhage, hydrocephalus, extra-axial collection or mass lesion/mass effect. Premature cortical atrophy Vascular: No hyperdense vessel or unexpected calcification. Skull: Normal. Negative for fracture or focal lesion. Sinuses/Orbits: No acute finding. IMPRESSION: 1. No acute finding. 2. Premature generalized atrophy. Electronically Signed   By: Marnee Spring M.D.   On: 01/26/2020 07:34   DG Chest Portable 1 View  Result Date: 01/26/2020 CLINICAL DATA:  Hypotension EXAM: PORTABLE CHEST 1 VIEW COMPARISON:  None FINDINGS: Low lung volumes with streaky basilar opacities favoring atelectasis. No consolidation, features of edema, pneumothorax, or effusion. The cardiomediastinal contours are unremarkable. No acute osseous or soft tissue abnormality. Telemetry leads  overlie the chest. IMPRESSION: Low lung volumes with streaky basilar opacities favoring atelectasis. Electronically Signed   By: Kreg Shropshire M.D.   On: 01/26/2020 01:31   US Abdomen Limited RUQ  Result Date: 01/26/2020 CLINICAL DATA:  Abnormal LFTs. EXAM: ULTRASOUND ABDOMEN LIMITED RIGHT UPPER QUADRANT COMPARISON:  None. FINDINGS: Gallbladder: Multiple gallstones are noted. There is mild gallbladder wall thickening with the gallbladder wall measuring up to approximately 3 mm. There is some ascites versus pericholecystic free fluid. The sonographic Eulah Pont sign is reported as negative. Common bile duct: Diameter: 7 mm Liver: Liver is enlarged and echogenic. Portal vein is patent on color Doppler imaging with normal direction of blood flow towards the liver. Other: There is a small volume of ascites in the upper abdomen. IMPRESSION: 1. There is cholelithiasis without secondary signs of acute cholecystitis. 2. Borderline dilated common bile duct measuring up to approximately 7 mm. Correlation with laboratory studies is recommended. 3. Hepatomegaly with likely underlying hepatic steatosis. 4. Small volume abdominal ascites. Electronically Signed   By: Katherine Mantle M.D.   On: 01/26/2020 17:52     Time Spent in minutes  30     Laverna Peace M.D on 01/27/2020 at 6:15 PM  To page go to [email or web address] - password [facility]

## 2020-01-28 DIAGNOSIS — T59891A Toxic effect of other specified gases, fumes and vapors, accidental (unintentional), initial encounter: Secondary | ICD-10-CM

## 2020-01-28 DIAGNOSIS — K7200 Acute and subacute hepatic failure without coma: Secondary | ICD-10-CM

## 2020-01-28 DIAGNOSIS — E538 Deficiency of other specified B group vitamins: Secondary | ICD-10-CM | POA: Diagnosis present

## 2020-01-28 DIAGNOSIS — D539 Nutritional anemia, unspecified: Secondary | ICD-10-CM | POA: Diagnosis present

## 2020-01-28 DIAGNOSIS — R160 Hepatomegaly, not elsewhere classified: Secondary | ICD-10-CM

## 2020-01-28 DIAGNOSIS — F1010 Alcohol abuse, uncomplicated: Secondary | ICD-10-CM

## 2020-01-28 DIAGNOSIS — K760 Fatty (change of) liver, not elsewhere classified: Secondary | ICD-10-CM

## 2020-01-28 DIAGNOSIS — G92 Toxic encephalopathy: Secondary | ICD-10-CM

## 2020-01-28 LAB — COMPREHENSIVE METABOLIC PANEL
ALT: 27 U/L (ref 0–44)
AST: 46 U/L — ABNORMAL HIGH (ref 15–41)
Albumin: 1.9 g/dL — ABNORMAL LOW (ref 3.5–5.0)
Alkaline Phosphatase: 108 U/L (ref 38–126)
Anion gap: 5 (ref 5–15)
BUN: <5 mg/dL — ABNORMAL LOW (ref 6–20)
CO2: 23 mmol/L (ref 22–32)
Calcium: 7.7 mg/dL — ABNORMAL LOW (ref 8.9–10.3)
Chloride: 108 mmol/L (ref 98–111)
Creatinine, Ser: 0.44 mg/dL (ref 0.44–1.00)
GFR calc Af Amer: >60 mL/min (ref >60)
GFR calc non Af Amer: >60 mL/min (ref >60)
Glucose, Bld: 111 mg/dL — ABNORMAL HIGH (ref 70–99)
Potassium: 3.5 mmol/L (ref 3.5–5.1)
Sodium: 136 mmol/L (ref 135–145)
Total Bilirubin: 0.3 mg/dL (ref 0.3–1.2)
Total Protein: 4.3 g/dL — ABNORMAL LOW (ref 6.5–8.1)

## 2020-01-28 LAB — GLUCOSE, CAPILLARY
Glucose-Capillary: 102 mg/dL — ABNORMAL HIGH (ref 70–99)
Glucose-Capillary: 102 mg/dL — ABNORMAL HIGH (ref 70–99)
Glucose-Capillary: 104 mg/dL — ABNORMAL HIGH (ref 70–99)
Glucose-Capillary: 106 mg/dL — ABNORMAL HIGH (ref 70–99)

## 2020-01-28 LAB — PROTIME-INR
INR: 0.9 (ref 0.8–1.2)
Prothrombin Time: 11.9 seconds (ref 11.4–15.2)

## 2020-01-28 MED ORDER — FOLIC ACID 1 MG PO TABS
1.0000 mg | ORAL_TABLET | Freq: Every day | ORAL | Status: DC
  Administered 2020-01-28: 1 mg via ORAL
  Filled 2020-01-28: qty 1

## 2020-01-28 MED ORDER — FOLIC ACID 1 MG PO TABS: 1.0000 mg | ORAL_TABLET | Freq: Every day | ORAL | Status: AC

## 2020-01-28 MED ORDER — LACTULOSE 10 GM/15ML PO SOLN: 10.0000 g | Freq: Three times a day (TID) | ORAL | 0 refills | Status: AC

## 2020-01-28 NOTE — TOC Progression Note (Signed)
Transition of Care [facility]) - Progression Note    Patient Details  Name: Krystal Marquez MRN: [id number] Date of Birth: [birth year]  Transition of Care [facility]) CM/SW Contact  Clearance Coots, LCSW Phone Number: 01/28/2020, 1:51 PM  Clinical Narrative:    Patient gave CSW verbal permission to talk with [facility]. CSW spoke with Act[facility] and faxed requested documentation to fax number provided. Dois Davenport requested to talk with the nurse about patient stay and current medical status. The facility is concern about the patient ability to walk without supervision. A PT consult has been placed. Patient can discharge back to [facility] today if she does not require supervision.     Expected Discharge Plan: IP Rehab Facility([facility]) Barriers to Discharge: No Barriers Identified  Expected Discharge Plan and Services Expected Discharge Plan: IP Rehab Facility In-house Referral: Clinical Social Work Discharge Planning Services: NA Post Acute Care Choice: IP Rehab Living arrangements for the past 2 months: Single Family Home                 DME Arranged: N/A DME Agency: NA       HH Arranged: NA HH Agency: NA         Social Determinants of Health (SDOH) Interventions    Readmission Risk Interventions No flowsheet data found.

## 2020-01-28 NOTE — Plan of Care (Signed)
  Problem: Activity: Goal: Risk for activity intolerance will decrease Outcome: Adequate for Discharge   Problem: Nutrition: Goal: Adequate nutrition will be maintained Outcome: Adequate for Discharge   Problem: Elimination: Goal: Will not experience complications related to bowel motility Outcome: Adequate for Discharge   Problem: Safety: Goal: Ability to remain free from injury will improve Outcome: Adequate for Discharge   

## 2020-01-28 NOTE — Evaluation (Signed)
Physical Therapy Evaluation and Discharge Patient Details Name: Krystal Marquez MRN: [id number] DOB: [birth year] Today's Date: 01/28/2020   History of Present Illness  Pt is a 49 y/o female admitted secondary to hypotension. PMH includes HTN and alcohol and benzodiazepine abuse. Per notes from detox facility.   Clinical Impression  Patient evaluated by Physical Therapy with no further acute PT needs identified. All education has been completed and the patient has no further questions. Pt overall steady with mobility tasks. No physical assist required. Able to perform horizontal and vertical head turns and able to step over objects without LOB. Per notes plan is to return to detox facility. See below for any follow-up Physical Therapy or equipment needs. PT is signing off. Thank you for this referral. If needs change, please re-consult.      Follow Up Recommendations No PT follow up    Equipment Recommendations  None recommended by PT    Recommendations for Other Services       Precautions / Restrictions Precautions Precautions: Fall Restrictions Weight Bearing Restrictions: No      Mobility  Bed Mobility Overal bed mobility: Modified Independent                Transfers Overall transfer level: Needs assistance   Transfers: Sit to/from Stand Sit to Stand: Supervision         General transfer comment: Supervision for safety and line management.   Ambulation/Gait Ambulation/Gait assistance: Supervision Gait Distance (Feet): 150 Feet Assistive device: None Gait Pattern/deviations: Step-through pattern;Decreased stride length Gait velocity: Decreased   General Gait Details: Overall steady gait with no LOB noted. Able to perform horizontal and vertical head turns and step over objects without LOB. Supervision for safety. No physical assist required.   Stairs            Wheelchair Mobility    Modified Rankin (Stroke Patients Only)       Balance Overall  balance assessment: No apparent balance deficits (not formally assessed)                                           Pertinent Vitals/Pain Pain Assessment: No/denies pain    Home Living Family/patient expects to be discharged to:: Other (Comment)(detox facility) Living Arrangements: Children Available Help at Discharge: Family;Available PRN/intermittently Type of Home: House Home Access: Stairs to enter Entrance Stairs-Rails: Right;Left;Can reach both Entrance Stairs-Number of Steps: 5 Home Layout: One level Home Equipment: None Additional Comments: Per pt, she is from home, but per notes currently at a detox facility.     Prior Function Level of Independence: Independent               Hand Dominance   Dominant Hand: Right    Extremity/Trunk Assessment   Upper Extremity Assessment Upper Extremity Assessment: Overall WFL for tasks assessed    Lower Extremity Assessment Lower Extremity Assessment: Generalized weakness    Cervical / Trunk Assessment Cervical / Trunk Assessment: Normal  Communication   Communication: No difficulties  Cognition Arousal/Alertness: Awake/alert Behavior During Therapy: WFL for tasks assessed/performed Overall Cognitive Status: No family/caregiver present to determine baseline cognitive functioning                                 General Comments: Somewhat slowed processing. Was alert and oriented X4.  General Comments      Exercises     Assessment/Plan    PT Assessment Patent does not need any further PT services  PT Problem List         PT Treatment Interventions      PT Goals (Current goals can be found in the Care Plan section)  Acute Rehab PT Goals Patient Stated Goal: to go home PT Goal Formulation: With patient Time For Goal Achievement: 01/28/20 Potential to Achieve Goals: Good    Frequency     Barriers to discharge        Co-evaluation               AM-PAC PT  "6 Clicks" Mobility  Outcome Measure Help needed turning from your back to your side while in a flat bed without using bedrails?: None Help needed moving from lying on your back to sitting on the side of a flat bed without using bedrails?: None Help needed moving to and from a bed to a chair (including a wheelchair)?: None Help needed standing up from a chair using your arms (e.g., wheelchair or bedside chair)?: None Help needed to walk in hospital room?: None Help needed climbing 3-5 steps with a railing? : A Little 6 Click Score: 23    End of Session Equipment Utilized During Treatment: Gait belt Activity Tolerance: Patient tolerated treatment well Patient left: in chair;with call bell/phone within reach;with chair alarm set Nurse Communication: Mobility status PT Visit Diagnosis: Other abnormalities of gait and mobility (R26.89)    Time: 1252-1310 PT Time Calculation (min) (ACUTE ONLY): 18 min   Charges:   PT Evaluation $PT Eval Low Complexity: 1 Low          Krystal Marquez, Krystal Marquez  Acute Rehabilitation Services  Pager: [phone] Office: [phone]   Krystal Marquez 01/28/2020, 2:56 PM

## 2020-01-31 LAB — CULTURE, BLOOD (ROUTINE X 2)
Culture: NO GROWTH
Culture: NO GROWTH
Special Requests: ADEQUATE

## 2020-02-02 LAB — VITAMIN B1: Vitamin B1 (Thiamine): 123.9 nmol/L (ref 66.5–200.0)

## 2021-03-09 IMAGING — CT CT HEAD W/O CM
3 series · 16 of 47 positions shown, 19 images · non-contrast
Comparison: None.

CLINICAL DATA: Encephalopathy.  Undergoing detox.

EXAM:
CT HEAD WITHOUT CONTRAST
TECHNIQUE: Contiguous axial images were obtained from the base of the skull
through the vertex without intravenous contrast.

[Series 2: head wo · axial · 0.47mm/px · z∈[-122,+3]mm · 10 of 31 slices shown, 13 images]
[im 3/31  brain]
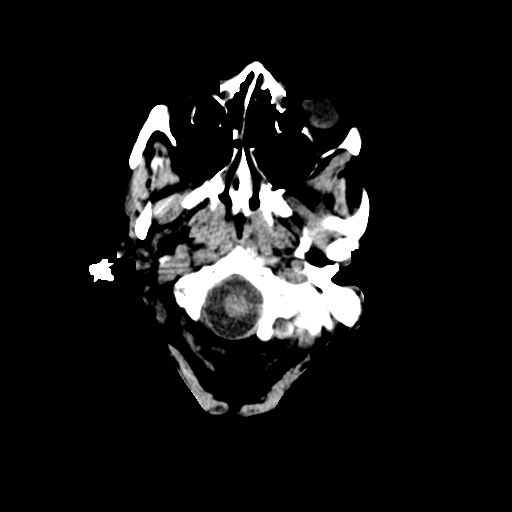
[im 3/31  bone]
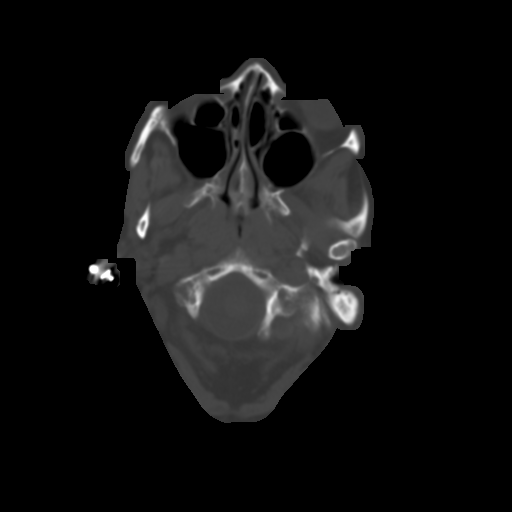
[im 6/31  brain]
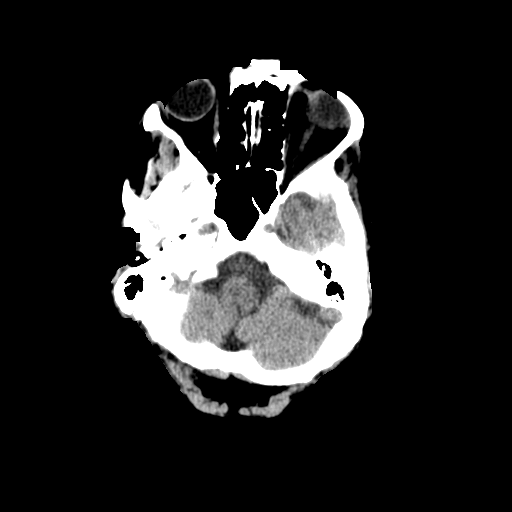
[im 9/31  brain]
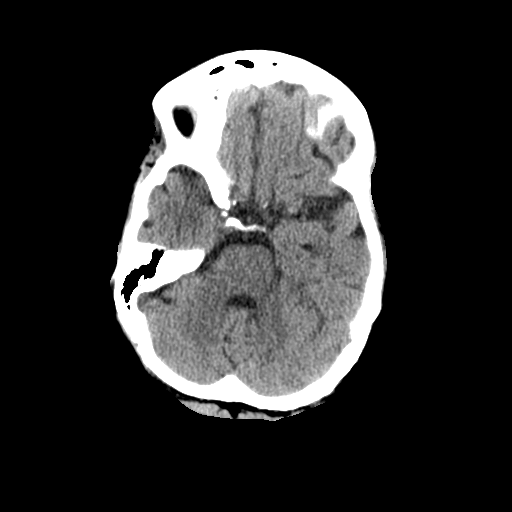
[im 11/31  brain]
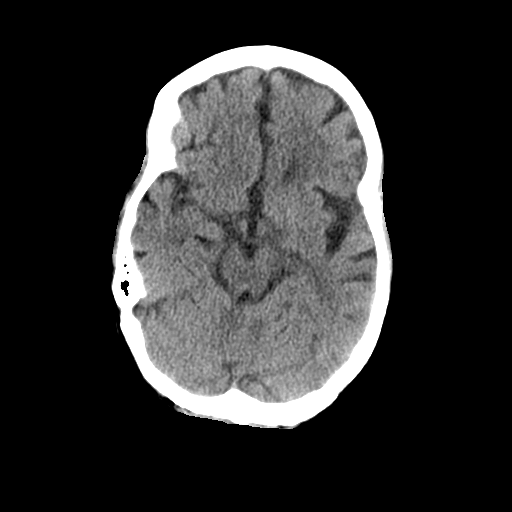
[im 14/31  brain]
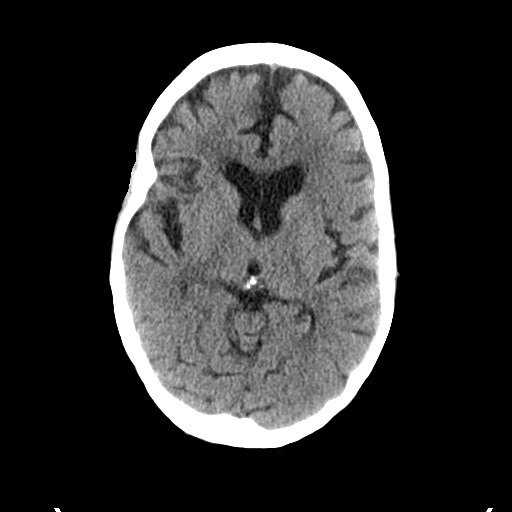
[im 14/31  bone]
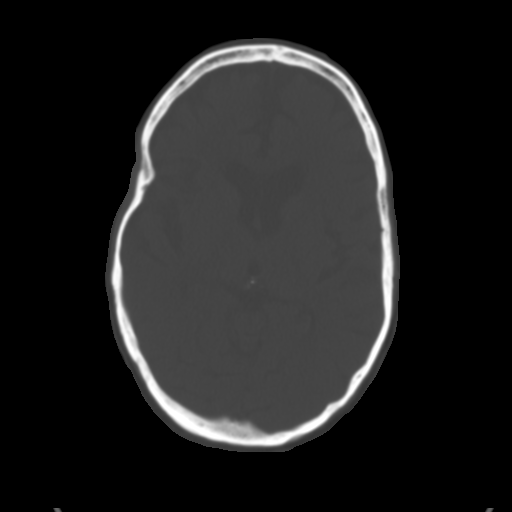
[im 17/31  brain]
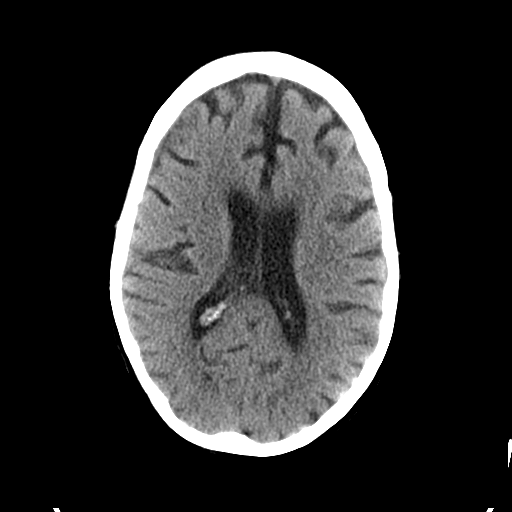
[im 20/31  brain]
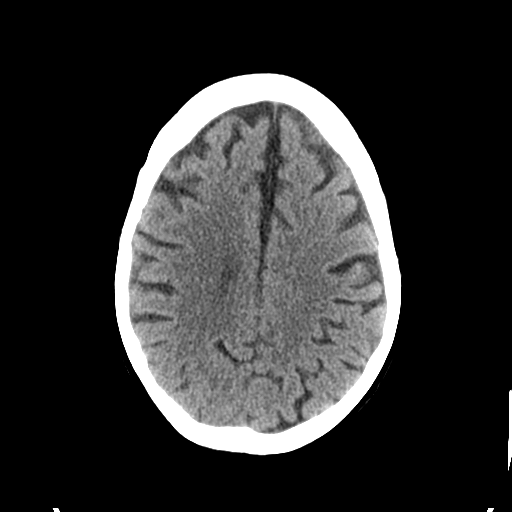
[im 23/31  brain]
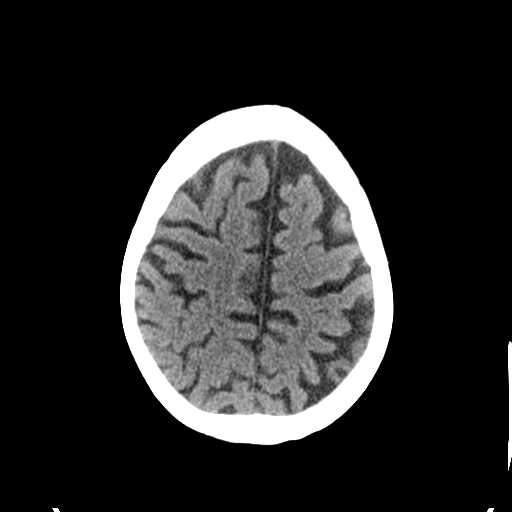
[im 25/31  brain]
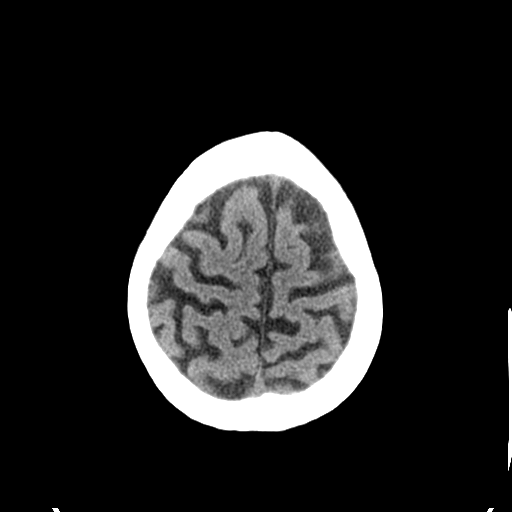
[im 25/31  bone]
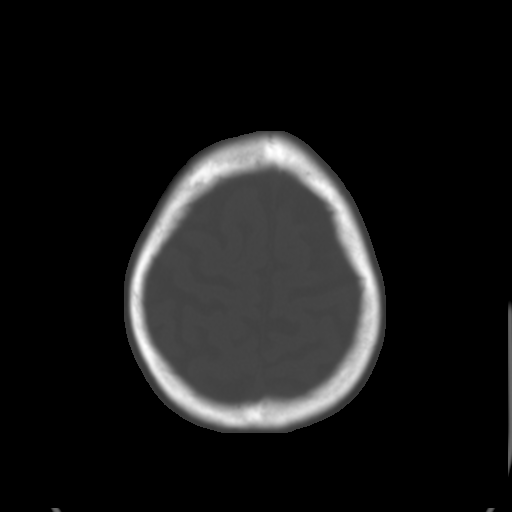
[im 28/31  brain]
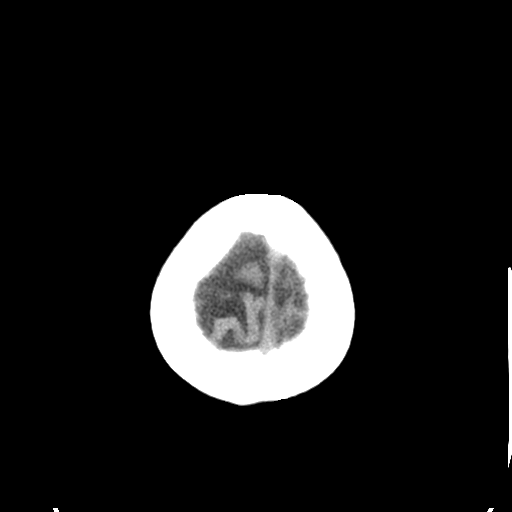

[Series 5: coronal soft tissue · coronal · 0.29mm/px · 3 of 66 slices shown]
[im 22/66  brain]
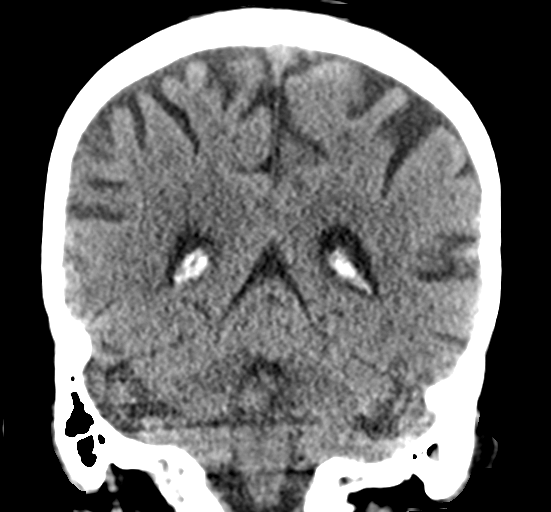
[im 29/66  brain]
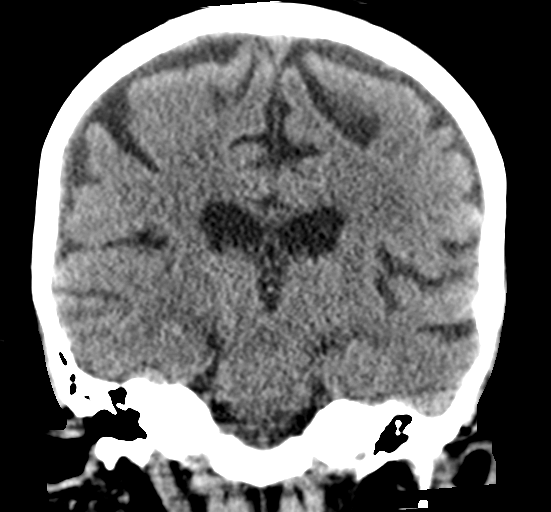
[im 37/66  brain]
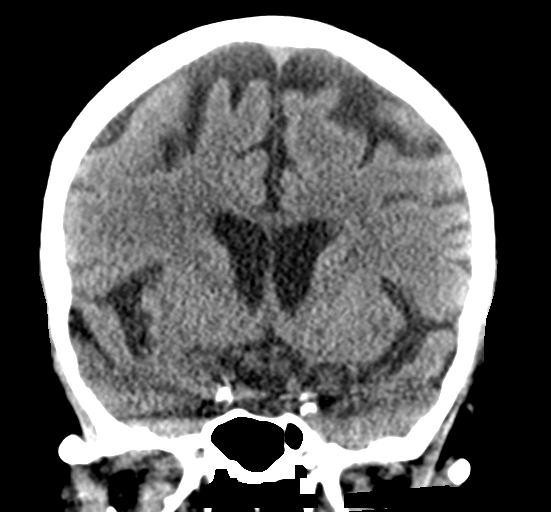

[Series 6: sagittal soft tissue · sagittal · 0.29mm/px · 3 of 53 slices shown]
[im 18/53  brain]
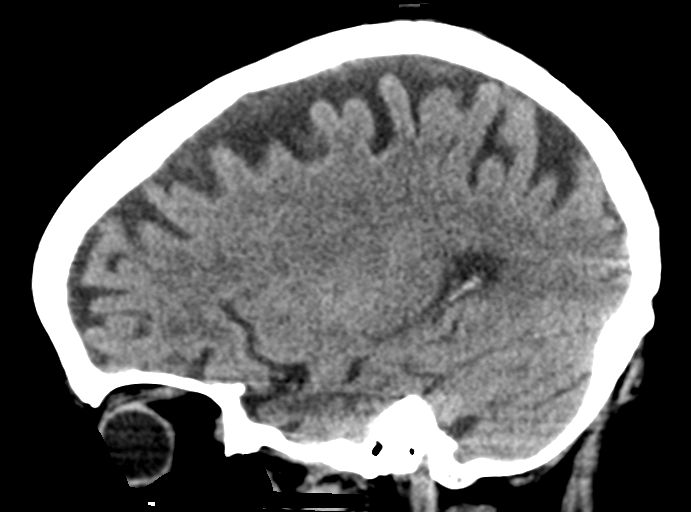
[im 27/53  brain]
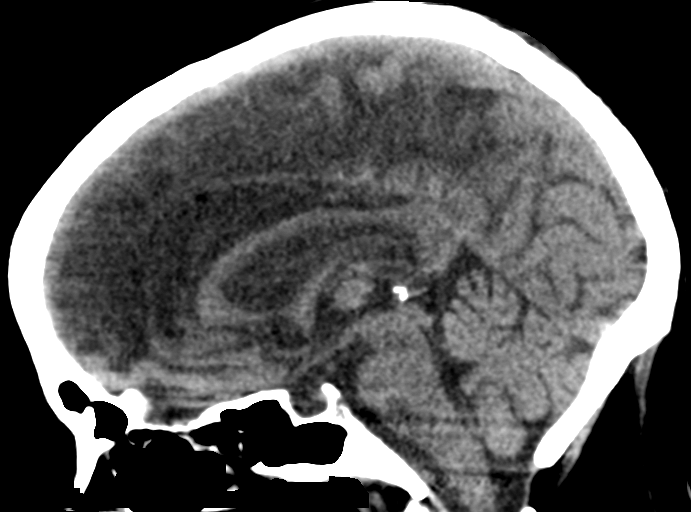
[im 35/53  brain]
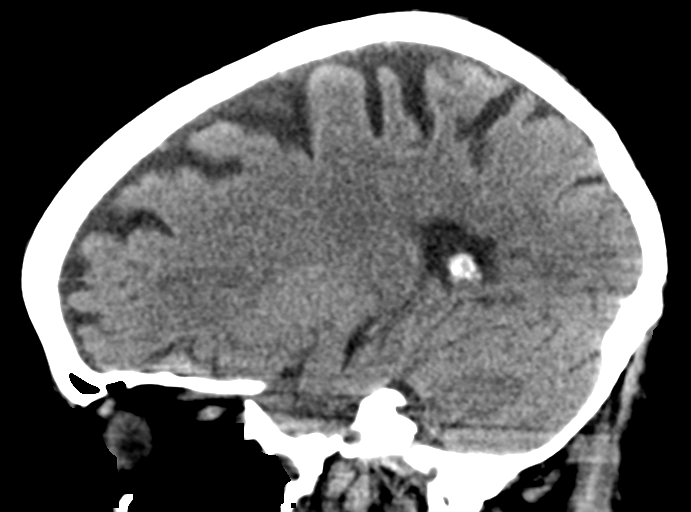

[16 of 47 positions shown; findings below may reference images not displayed]

FINDINGS: Brain: No evidence of acute infarction, hemorrhage, hydrocephalus,
extra-axial collection or mass lesion/mass effect. Premature
cortical atrophy

Vascular: No hyperdense vessel or unexpected calcification.

Skull: Normal. Negative for fracture or focal lesion.

Sinuses/Orbits: No acute finding.
IMPRESSION: 1. No acute finding.
2. Premature generalized atrophy.
